# Patient Record
Sex: Female | Born: 2000 | Race: Black or African American | Hispanic: No | Marital: Single | Smoking: Former smoker
Health system: Southern US, Community
[De-identification: ages and names within clinical notes are randomized; demographics above are authoritative.]

## PROBLEM LIST (undated history)

## (undated) DIAGNOSIS — F32A Depression, unspecified: Secondary | ICD-10-CM

## (undated) DIAGNOSIS — F419 Anxiety disorder, unspecified: Secondary | ICD-10-CM

## (undated) DIAGNOSIS — Z789 Other specified health status: Secondary | ICD-10-CM

## (undated) HISTORY — PX: INCISION AND DRAINAGE OF PERITONSILLAR ABCESS: SHX6257

## (undated) HISTORY — DX: Depression, unspecified: F32.A

---
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
## 2021-03-17 ENCOUNTER — Encounter (HOSPITAL_COMMUNITY): Payer: Self-pay | Admitting: Emergency Medicine

## 2021-03-17 ENCOUNTER — Emergency Department (HOSPITAL_COMMUNITY)
Admission: EM | Admit: 2021-03-17 | Discharge: 2021-03-17 | Disposition: A | Discharge status: Home / Self Care | Payer: Medicaid Other | Attending: Emergency Medicine | Admitting: Emergency Medicine

## 2021-03-17 ENCOUNTER — Other Ambulatory Visit: Payer: Self-pay

## 2021-03-17 DIAGNOSIS — F17290 Nicotine dependence, other tobacco product, uncomplicated: Secondary | ICD-10-CM | POA: Diagnosis not present

## 2021-03-17 DIAGNOSIS — R042 Hemoptysis: Secondary | ICD-10-CM | POA: Diagnosis not present

## 2021-03-17 DIAGNOSIS — Z20822 Contact with and (suspected) exposure to covid-19: Secondary | ICD-10-CM | POA: Insufficient documentation

## 2021-03-17 DIAGNOSIS — H1031 Unspecified acute conjunctivitis, right eye: Secondary | ICD-10-CM

## 2021-03-17 DIAGNOSIS — Z79899 Other long term (current) drug therapy: Secondary | ICD-10-CM | POA: Diagnosis not present

## 2021-03-17 DIAGNOSIS — J029 Acute pharyngitis, unspecified: Secondary | ICD-10-CM | POA: Diagnosis not present

## 2021-03-17 DIAGNOSIS — H538 Other visual disturbances: Secondary | ICD-10-CM | POA: Diagnosis present

## 2021-03-17 LAB — SARS CORONAVIRUS 2 (TAT 6-24 HRS): SARS Coronavirus 2: NEGATIVE

## 2021-03-17 MED ORDER — POLYMYXIN B-TRIMETHOPRIM 10000-0.1 UNIT/ML-% OP SOLN: 1.0000 [drp] | Freq: Four times a day (QID) | OPHTHALMIC | 0 refills | Status: AC

## 2021-03-17 NOTE — ED Notes (Signed)
Patient given discharge instructions. Questions were answered. Patient verbalized understanding of discharge instructions and care at home.  Pt discharged with family.

## 2021-03-17 NOTE — ED Triage Notes (Addendum)
C/o R eye swelling and sore throat since Saturday.

## 2021-03-17 NOTE — Discharge Instructions (Signed)
If you develop fever, new or worsening eye pain, trouble breathing or swallowing, or any other new/concerning symptoms then return to the ER for evaluation.

## 2021-03-17 NOTE — ED Provider Notes (Signed)
[facility] EMERGENCY DEPARTMENT Provider Note   CSN: [id number] Arrival date & time: 03/17/21  3532     History No chief complaint on file.   Kristine Franco is a 20 y.o. female.  HPI 20 year old female presents with right eye redness and swelling.  This has been ongoing for 3 days.  Her eye is itchy and some blurry vision.  The light hurts her eyes.  She has had red eyes as well.  She does not wear contacts.  This morning she developed a mild sore throat as well.  She chronically coughs in the morning and when she coughs this morning there was a tinge of blood.  However no other significant or new cough, shortness of breath or fever.  No rhinorrhea. She has had yellow crusting of her eye  Past Medical History:  Diagnosis Date  . Anxiety   . Medical history non-contributory     Patient Active Problem List   Diagnosis Date Noted  . MDD (major depressive disorder), severe (HCC) 03/04/2019  . Suicide attempt (HCC)   . Acetaminophen overdose, intentional self-harm, initial encounter (HCC) 03/01/2019  . Anxiety 03/01/2019  . Suicidal behavior with attempted self-injury (HCC) 03/01/2019  . Cannabis use disorder, mild, abuse   . Severe recurrent major depression without psychotic features (HCC) 06/29/2018  . 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 Suicidal ideation 08/25/2017  . MDD (major depressive disorder), recurrent severe, without psychosis (HCC) 08/24/2017    Past Surgical History:  Procedure Laterality Date  . INCISION AND DRAINAGE OF PERITONSILLAR ABCESS N/A 06/23/2017   Procedure: INCISION AND DRAINAGE OF PERITONSILLAR ABCESS;  Surgeon: Graylin Shiver, MD;  Location: [facility] OR;  Service: ENT;  Laterality: N/A;     OB History    Gravida  1   Para      Term      Preterm      AB  1   Living        SAB  1   IAB      Ectopic      Multiple      Live Births              Family  History  Problem Relation Age of Onset  . Diabetes Maternal Grandmother   . Diabetes Maternal Grandfather     Social History   Tobacco Use  . Smoking status: Current Some Day Smoker    Types: Cigars  . Smokeless tobacco: Never Used  . Tobacco comment: black & milds socially  Vaping Use  . Vaping Use: Never used  Substance Use Topics  . Alcohol use: Yes    Comment: social  . Drug use: Yes    Types: Marijuana    Comment: social    Home Medications Prior to Admission medications   Medication Sig Start Date End Date Taking? Authorizing Provider  trimethoprim-polymyxin b (POLYTRIM) ophthalmic solution Place 1-2 drops into the right eye QID for 5 days. 03/17/21 03/22/21 Yes Pricilla Loveless, MD  ciprofloxacin (CIPRO) 500 MG tablet Take 1 tablet (500 mg total) by mouth 2 (two) times daily. For UTI 03/08/19   Aldean Baker, NP  citalopram (CELEXA) 10 MG tablet Take 1 tablet (10 mg total) by mouth daily. 03/09/19   Aldean Baker, NP  conjugated estrogens (PREMARIN) vaginal cream Place 1 Applicatorful vaginally daily. For pain 03/09/19   Aldean Baker, NP  ferrous sulfate 325 (65 FE) MG EC tablet Take  325 mg by mouth daily.    [provider]    Allergies    Patient has no known allergies.  Review of Systems   Review of Systems  Constitutional: Negative for fever.  HENT: Positive for sore throat.   Eyes: Positive for discharge, redness and itching.  Respiratory: Negative for cough and shortness of breath.   All other systems reviewed and are negative.   Physical Exam Updated Vital Signs BP 111/76 (BP Location: Right Arm)   Pulse 80   Temp 98.8 F (37.1 C) (Oral)   Resp 16   LMP 01/25/2021   SpO2 98%   Physical Exam Vitals and nursing note reviewed.  Constitutional:      General: She is not in acute distress.    Appearance: She is well-developed. She is not ill-appearing or diaphoretic.  HENT:     Head: Normocephalic and atraumatic.     Right Ear: External ear  normal.     Left Ear: External ear normal.     Nose: Nose normal.  Eyes:     General:        Left eye: No discharge.     Extraocular Movements: Extraocular movements intact.     Pupils: Pupils are equal, round, and reactive to light.     Comments: Right periorbital swelling. There is conjunctival injection of the right eye. Some tearing  Cardiovascular:     Rate and Rhythm: Normal rate and regular rhythm.     Heart sounds: Normal heart sounds.  Pulmonary:     Effort: Pulmonary effort is normal.     Breath sounds: Normal breath sounds. No wheezing, rhonchi or rales.  Abdominal:     General: There is no distension.  Skin:    General: Skin is warm and dry.  Neurological:     Mental Status: She is alert.  Psychiatric:        Mood and Affect: Mood is not anxious.     ED Results / Procedures / Treatments   Labs (all labs ordered are listed, but only abnormal results are displayed) Labs Reviewed  SARS CORONAVIRUS 2 (TAT 6-24 HRS)    EKG None  Radiology No results found.  Procedures Procedures   Medications Ordered in ED Medications - No data to display  ED Course  I have reviewed the triage vital signs and the nursing notes.  Pertinent labs & imaging results that were available during my care of the patient were reviewed by me and considered in my medical decision making (see chart for details).    MDM Rules/Calculators/A&P                          Throat is clear at this time.  Her vital signs are normal.  Her lungs are also clear.  While she had a tinge of blood I do not think she needs a hemoptysis work-up necessarily.  She likely has a viral infection causing the sore throat as well as the conjunctivitis but given 3 days of persistent symptoms we will also prescribe trimethoprim polymyxin drops.  She does not wear contacts.  There has been no trauma to her eye.  We will also do a COVID test because of the sore throat.  Will discharge home with return  precautions. Final Clinical Impression(s) / ED Diagnoses Final diagnoses:  Acute conjunctivitis of right eye, unspecified acute conjunctivitis type    Rx / DC Orders ED Discharge Orders  Ordered    trimethoprim-polymyxin b (POLYTRIM) ophthalmic solution  4 times daily        03/17/21 0805           Pricilla Loveless, MD 03/17/21 [phone]

## 2021-10-19 NOTE — L&D Delivery Note (Signed)
OB/GYN Faculty Practice Delivery Note  Kristine Franco is a 21 y.o. G2P0010 s/p VD at [redacted]w[redacted]d. She was admitted for IOL 2/2 NRNST.   ROM: 24h 11m with clear fluid GBS Status:  Positive/-- (08/30 0000) Maximum Maternal Temperature: 97.9  Labor Progress: Initial SVE: 3/70/-2. She then progressed to complete.   Delivery Date/Time: 10/19 @2241  Delivery: Called to room and patient was complete and pushing. Head delivered LOA. No nuchal cord present. Shoulder and body delivered in usual fashion. Infant with spontaneous cry, placed on mother's abdomen, dried and stimulated. Cord clamped x 2 after 1-minute delay, and cut by FOB. Cord blood drawn. Placenta delivered spontaneously with gentle cord traction. Fundus firm with massage and Pitocin. Labia, perineum, vagina, and cervix inspected; right periurethral laceration with brisk bleeding resolved with 3.0 vicryl figure 8 stitch.  Baby Weight: pending  Placenta: 3 vessel, intact. Sent to L&D Complications: None Lacerations: Right periurethral EBL: 82 mL Analgesia: Epidural   Infant:  APGAR (1 MIN): 9   APGAR (5 MINS): 9    Eamon Tantillo Autry-Lott, DO OB Fellow, [place] [facility] [place] 08/06/2022, 11:04 PM

## 2021-12-08 ENCOUNTER — Ambulatory Visit (HOSPITAL_COMMUNITY)
Admission: EM | Admit: 2021-12-08 | Discharge: 2021-12-08 | Disposition: A | Discharge status: Home / Self Care | Payer: Medicaid Other | Attending: Family Medicine | Admitting: Family Medicine

## 2021-12-08 ENCOUNTER — Other Ambulatory Visit: Payer: Self-pay

## 2021-12-08 ENCOUNTER — Encounter (HOSPITAL_COMMUNITY): Payer: Self-pay

## 2021-12-08 DIAGNOSIS — Z3201 Encounter for pregnancy test, result positive: Secondary | ICD-10-CM | POA: Diagnosis not present

## 2021-12-08 DIAGNOSIS — A084 Viral intestinal infection, unspecified: Secondary | ICD-10-CM

## 2021-12-08 DIAGNOSIS — Z3A01 Less than 8 weeks gestation of pregnancy: Secondary | ICD-10-CM

## 2021-12-08 LAB — POC URINE PREG, ED: Preg Test, Ur: POSITIVE — AB

## 2021-12-08 MED ORDER — DOXYLAMINE-PYRIDOXINE 10-10 MG PO TBEC: 2.0000 | DELAYED_RELEASE_TABLET | Freq: Every day | ORAL | 0 refills | Status: DC

## 2021-12-08 NOTE — ED Triage Notes (Signed)
Pt presents with c/o vomiting since Wednesday.   Pt states she has not been able to hold anything down. Denies stomach pain.

## 2021-12-08 NOTE — ED Provider Notes (Signed)
[facility]    CSN: [id number] Arrival date & time: 12/08/21  1747      History   Chief Complaint Chief Complaint  Patient presents with   Emesis    HPI Kristine Franco is a 21 y.o. female presenting with vomiting for about 4 days.  Medical history cannabis use disorder, acetaminophen overdose intentional, depression.  Today endorses 4 days of bilious vomiting and watery diarrhea.  Persistent nausea.  States 3 episodes of vomiting today, this is primarily bilious, but it was filled with some [name] earlier today.  Never any hemoptysis.  Denies abdominal pain.  Has not tried interventions at home.  Does state that she ate out 1 day before symptoms began.  Denies sore throat, cough, congestion. No prior history abdominal procedures.   HPI  Past Medical History:  Diagnosis Date   Anxiety    Medical history non-contributory     Patient Active Problem List   Diagnosis Date Noted   MDD (major depressive disorder), severe ([place]) 03/04/2019   Suicide attempt ([place])    Acetaminophen overdose, intentional self-harm, initial encounter ([place]) 03/01/2019   Anxiety 03/01/2019   Suicidal behavior with attempted self-injury ([place]) 03/01/2019   Cannabis use disorder, mild, abuse    Severe recurrent major depression without psychotic features ([place]) 06/29/2018   MDD (major depressive disorder), recurrent, severe, with psychosis ([place]) 08/25/2017   Suicidal ideation 08/25/2017   MDD (major depressive disorder), recurrent severe, without psychosis ([place]) 08/24/2017    Past Surgical History:  Procedure Laterality Date   INCISION AND DRAINAGE OF PERITONSILLAR ABCESS N/A 06/23/2017   Procedure: INCISION AND DRAINAGE OF PERITONSILLAR ABCESS;  Surgeon: Helayne Seminole, MD;  Location: [facility];  Service: ENT;  Laterality: N/A;    OB History     Gravida  1   Para      Term      Preterm      AB  1   Living         SAB  1   IAB      Ectopic      Multiple      Live  Births               Home Medications    Prior to Admission medications   Medication Sig Start Date End Date Taking? Authorizing Provider  Doxylamine-Pyridoxine (DICLEGIS) 10-10 MG TBEC Take 2 tablets by mouth daily. Start with 2 delayed-release tablets by mouth at bedtime. If symptoms not adequately controlled, increase dose to 4 tablets each day (1 tab in AM, 1 tab mid-afternoon, and 2 tabs at bedtime) 12/08/21  Yes Hazel Sams, PA-C  citalopram (CELEXA) 10 MG tablet Take 1 tablet (10 mg total) by mouth daily. 03/09/19   Connye Burkitt, NP    Family History Family History  Problem Relation Age of Onset   Diabetes Maternal Grandmother    Diabetes Maternal Grandfather     Social History Social History   Tobacco Use   Smoking status: Some Days    Types: Cigars   Smokeless tobacco: Never   Tobacco comments:    black & milds socially  Vaping Use   Vaping Use: Never used  Substance Use Topics   Alcohol use: Yes    Comment: social   Drug use: Yes    Types: Marijuana    Comment: social     Allergies   Patient has no known allergies.   Review of Systems Review of Systems  Constitutional:  Negative for appetite change, chills, diaphoresis, fever and unexpected weight change.  HENT:  Negative for congestion, ear pain, sinus pressure, sinus pain, sneezing, sore throat and trouble swallowing.   Respiratory:  Negative for cough, chest tightness and shortness of breath.   Cardiovascular:  Negative for chest pain.  Gastrointestinal:  Positive for diarrhea, nausea and vomiting. Negative for abdominal distention, abdominal pain, anal bleeding, blood in stool, constipation and rectal pain.  Genitourinary:  Negative for dysuria, flank pain, frequency and urgency.  Musculoskeletal:  Negative for back pain and myalgias.  Neurological:  Negative for dizziness, light-headedness and headaches.  All other systems reviewed and are negative.   Physical Exam Triage Vital Signs ED  Triage Vitals  Enc Vitals Group     BP 12/08/21 1904 108/77     Pulse Rate 12/08/21 1904 65     Resp 12/08/21 1904 17     Temp 12/08/21 1904 98.1 F (36.7 C)     Temp Source 12/08/21 1904 Oral     SpO2 12/08/21 1904 96 %     Weight --      Height --      Head Circumference --      Peak Flow --      Pain Score 12/08/21 1903 0     Pain Loc --      Pain Edu? --      Excl. in [place]? --    No data found.  Updated Vital Signs BP 108/77 (BP Location: Right Arm)    Pulse 65    Temp 98.1 F (36.7 C) (Oral)    Resp 17    LMP  (LMP Unknown)    SpO2 96%   Visual Acuity Right Eye Distance:   Left Eye Distance:   Bilateral Distance:    Right Eye Near:   Left Eye Near:    Bilateral Near:     Physical Exam Vitals reviewed.  Constitutional:      General: She is not in acute distress.    Appearance: Normal appearance. She is not ill-appearing.  HENT:     Head: Normocephalic and atraumatic.     Mouth/Throat:     Mouth: Mucous membranes are moist.     Comments: Moist mucous membranes Eyes:     Extraocular Movements: Extraocular movements intact.     Pupils: Pupils are equal, round, and reactive to light.  Cardiovascular:     Rate and Rhythm: Normal rate and regular rhythm.     Heart sounds: Normal heart sounds.  Pulmonary:     Effort: Pulmonary effort is normal.     Breath sounds: Normal breath sounds. No wheezing, rhonchi or rales.  Abdominal:     General: Bowel sounds are increased. There is no distension.     Palpations: Abdomen is soft. There is no mass.     Tenderness: There is generalized abdominal tenderness. There is no right CVA tenderness, left CVA tenderness, guarding or rebound.     Comments: Generalized TTP without focal component. No mass, hernia. No guarding, rebound. Comfortable throughout exam  Skin:    General: Skin is warm.     Capillary Refill: Capillary refill takes less than 2 seconds.     Comments: Good skin turgor  Neurological:     General: No focal  deficit present.     Mental Status: She is alert and oriented to person, place, and time.  Psychiatric:        Mood and Affect: Mood normal.  Behavior: Behavior normal.     UC Treatments / Results  Labs (all labs ordered are listed, but only abnormal results are displayed) Labs Reviewed  POC URINE PREG, ED - Abnormal; Notable for the following components:      Result Value   Preg Test, Ur POSITIVE (*)    All other components within normal limits    EKG   Radiology No results found.  Procedures Procedures (including critical care time)  Medications Ordered in UC Medications - No data to display  Initial Impression / Assessment and Plan / UC Course  I have reviewed the triage vital signs and the nursing notes.  Pertinent labs & imaging results that were available during my care of the patient were reviewed by me and considered in my medical decision making (see chart for details).     This patient is a very pleasant 21 y.o. year old female presenting with viral gastroenteritis, and unplanned pregnancy approx [redacted] weeks gestation. Afebrile, nontachy. Appears fairly well hydrated. Cap refill <2 seconds.   LMP 1 month ago. U-preg positive.  Initially planned to treat with Zofran ODT; following positive pregnancy test, elected to manage with diclegis instead. Good hyration, BRAT diet.   F/u with [facility] if symptoms worsen/persist. Provided with women's health resources  ED return precautions discussed. Patient verbalizes understanding and agreement.  Discussed treatment plan with attending physician Dr. Windy Carina who is in agreement.     Final Clinical Impressions(s) / UC Diagnoses   Final diagnoses:  Less than [redacted] weeks gestation of pregnancy  Viral gastroenteritis     Discharge Instructions      -Start the Diclegis for your nausea and vomiting. -Make sure to drink plenty of fluids including water, Gatorade, etc. -If your symptoms get worse, or are not  adequately controlled on the medication, follow-up with the women's center.  Information below.  This includes you are not able to keep fluids down despite treatment, you develop new or worsening abdominal pain, new vaginal bleeding, etc.     ED Prescriptions     Medication Sig Dispense Auth. Provider   Doxylamine-Pyridoxine (DICLEGIS) 10-10 MG TBEC Take 2 tablets by mouth daily. Start with 2 delayed-release tablets by mouth at bedtime. If symptoms not adequately controlled, increase dose to 4 tablets each day (1 tab in AM, 1 tab mid-afternoon, and 2 tabs at bedtime) 60 tablet Hazel Sams, PA-C      PDMP not reviewed this encounter.   Hazel Sams, PA-C 12/08/21 1931

## 2021-12-08 NOTE — ED Notes (Signed)
Pt called from lobby, no answer. 

## 2021-12-08 NOTE — Discharge Instructions (Addendum)
-  Start the Diclegis for your nausea and vomiting. -Make sure to drink plenty of fluids including water, Gatorade, etc. -If your symptoms get worse, or are not adequately controlled on the medication, follow-up with the women's center.  Information below.  This includes you are not able to keep fluids down despite treatment, you develop new or worsening abdominal pain, new vaginal bleeding, etc.

## 2022-01-19 ENCOUNTER — Ambulatory Visit (INDEPENDENT_AMBULATORY_CARE_PROVIDER_SITE_OTHER): Payer: Medicaid Other

## 2022-01-19 VITALS — BP 111/70 | HR 80 | Ht 62.5 in | Wt 131.3 lb

## 2022-01-19 DIAGNOSIS — Z3481 Encounter for supervision of other normal pregnancy, first trimester: Secondary | ICD-10-CM

## 2022-01-19 DIAGNOSIS — O3680X0 Pregnancy with inconclusive fetal viability, not applicable or unspecified: Secondary | ICD-10-CM | POA: Diagnosis not present

## 2022-01-19 DIAGNOSIS — Z3402 Encounter for supervision of normal first pregnancy, second trimester: Secondary | ICD-10-CM | POA: Insufficient documentation

## 2022-01-19 DIAGNOSIS — Z3491 Encounter for supervision of normal pregnancy, unspecified, first trimester: Secondary | ICD-10-CM | POA: Insufficient documentation

## 2022-01-19 MED ORDER — BLOOD PRESSURE KIT DEVI: 1.0000 | 0 refills | Status: DC

## 2022-01-19 MED ORDER — GOJJI WEIGHT SCALE MISC: 1.0000 | 0 refills | Status: DC

## 2022-01-19 NOTE — Progress Notes (Cosign Needed)
New OB Intake ? ?I connected with  Rajean Chandra on 01/19/22 at  3:10 PM EDT by in person  and verified that I am speaking with the correct person using two identifiers. Nurse is located at [facility] and pt is located at [facility]. ? ?I discussed the limitations, risks, security and privacy concerns of performing an evaluation and management service by telephone and the availability of in person appointments. I also discussed with the patient that there may be a patient responsible charge related to this service. The patient expressed understanding and agreed to proceed. ? ?I explained I am completing New OB Intake today. We discussed her EDD of 08/03/22 that is based on LMP of 10/27/21. Pt is G2/P0010. I reviewed her allergies, medications, Medical/Surgical/OB history, and appropriate screenings. I informed her of [facility] services. Based on history, this is a/an  pregnancy uncomplicated .  ? ?Patient Active Problem List  ? Diagnosis Date Noted  ? MDD (major depressive disorder), severe (HCC) 03/04/2019  ? Suicide attempt [facility])   ? Acetaminophen overdose, intentional self-harm, initial encounter (HCC) 03/01/2019  ? Anxiety 03/01/2019  ? Suicidal behavior with attempted self-injury (HCC) 03/01/2019  ? Cannabis use disorder, mild, abuse   ? Severe recurrent major depression without psychotic features (HCC) 06/29/2018  ? MDD (major depressive disorder), recurrent, severe, with psychosis (HCC) 08/25/2017  ? Suicidal ideation 08/25/2017  ? MDD (major depressive disorder), recurrent severe, without psychosis (HCC) 08/24/2017  ? ? ?Concerns addressed today ? ?Delivery Plans:  ?Plans to deliver at [facility]  ? ?MyChart/Babyscripts ?MyChart access verified. I explained pt will have some visits in office and some virtually. Babyscripts instructions given and order placed. Patient verifies receipt of registration text/e-mail. Account successfully created and app downloaded. ? ?Blood Pressure Cuff  ?Blood pressure cuff ordered for  patient to pick-up from [employer]. Explained after first prenatal appt pt will check weekly and document in Babyscripts. ? ?Weight scale: Patient does / does not  have weight scale. Weight scale ordered for patient to pick up from [employer].  ? ?Anatomy US ?Explained first scheduled [place] will be around 19 weeks. Dating and viability scan performed today ? ?Labs ?Discussed Avel[facility] Laine genetic screening with patient. Would like both Panorama and Horizon drawn at new OB visit.Also if interested in genetic testing, tell patient she will need AFP 15-21 weeks to complete genetic testing .Routine prenatal labs needed. ? ?Covid Vaccine ?Patient has not covid vaccine.  ?  ?Is patient interested in [place]? Yes  "Interested in [facility] - Schedule next visit with CNM" on sticky note ? ?Informed patient of [facility] website  and placed link in her AVS.  ? ?Social Determinants of Health ?Food Insecurity: Patient denies food insecurity. ?WIC Referral: Patient is interested in referral to [facility].  ?Transportation: Patient denies transportation needs. ?Childcare: Discussed no children allowed at ultrasound appointments. Offered childcare services; patient declines childcare services at this time. ? ?Send link to Pregnancy Navigators ? ? ?Placed OB Box on problem list and updated ? ?First visit review ?I reviewed new OB appt with pt. I explained she will have a pelvic exam, ob bloodwork with genetic screening, and PAP smear. Explained pt will be seen by Coral Ceo at first visit; encounter routed to appropriate provider. Explained that patient will be seen by pregnancy navigator following visit with provider. [facility] information placed in AVS.  ? ?Hamilton Capri, RN ?01/19/2022  3:10 PM  ?

## 2022-01-20 NOTE — Progress Notes (Signed)
Agree with nurses's documentation of this patient's clinic encounter.  Lynelle Weiler L, MD  

## 2022-01-28 ENCOUNTER — Encounter: Payer: Self-pay | Admitting: Obstetrics

## 2022-01-28 ENCOUNTER — Ambulatory Visit (INDEPENDENT_AMBULATORY_CARE_PROVIDER_SITE_OTHER): Payer: Medicaid Other | Admitting: Obstetrics

## 2022-01-28 ENCOUNTER — Other Ambulatory Visit (HOSPITAL_COMMUNITY)
Admission: RE | Admit: 2022-01-28 | Discharge: 2022-01-28 | Disposition: A | Discharge status: Home / Self Care | Payer: Medicaid Other | Source: Ambulatory Visit | Attending: Obstetrics | Admitting: Obstetrics

## 2022-01-28 VITALS — BP 112/65 | HR 76 | Wt 130.0 lb

## 2022-01-28 DIAGNOSIS — Z3A13 13 weeks gestation of pregnancy: Secondary | ICD-10-CM | POA: Diagnosis not present

## 2022-01-28 DIAGNOSIS — Z3480 Encounter for supervision of other normal pregnancy, unspecified trimester: Secondary | ICD-10-CM

## 2022-01-28 NOTE — Progress Notes (Signed)
Subjective:  ? ? Kristine Franco is being seen today for her first obstetrical visit.  This is not a planned pregnancy. She is at [redacted]w[redacted]d gestation. Her obstetrical history is significant for  none . Relationship with FOB: significant other, not living together. Patient does not intend to breast feed. Pregnancy history fully reviewed. ? ?The information documented in the HPI was reviewed and verified. ? ?Menstrual History: ?OB History   ? ? Gravida  ?2  ? Para  ?   ? Term  ?   ? Preterm  ?   ? AB  ?1  ? Living  ?   ?  ? ? SAB  ?1  ? IAB  ?   ? Ectopic  ?   ? Multiple  ?   ? Live Births  ?   ?   ?  ?  ?  ? ?Patient's last menstrual period was 10/27/2021. ?  ? ?Past Medical History:  ?Diagnosis Date  ? Anxiety   ? Depression   ? Medical history non-contributory   ?  ?Past Surgical History:  ?Procedure Laterality Date  ? INCISION AND DRAINAGE OF PERITONSILLAR ABCESS N/A 06/23/2017  ? Procedure: INCISION AND DRAINAGE OF PERITONSILLAR ABCESS;  Surgeon: Graylin Shiver, MD;  [facility];  Service: ENT;  Laterality: N/A;  ?  ?(Not in a hospital admission) ? ?No Known Allergies  ?Social History  ? ?Tobacco Use  ? Smoking status: Former  ?  Types: Cigars  ?  Quit date: 12/16/2021  ?  Years since quitting: 0.1  ? Smokeless tobacco: Never  ? Tobacco comments:  ?  black & milds socially  ?Substance Use Topics  ? Alcohol use: Not Currently  ?  Comment: social, prior to pregnancy  ?  ?Family History  ?Problem Relation Age of Onset  ? Hypertension Mother   ? Diabetes Maternal Grandmother   ? Diabetes Maternal Grandfather   ?  ? ?Review of Systems ?Constitutional: negative for weight loss ?Gastrointestinal: negative for vomiting ?Genitourinary:negative for genital lesions and vaginal discharge and dysuria ?Musculoskeletal:negative for back pain ?Behavioral/Psych: negative for abusive relationship, depression, illegal drug usage and tobacco use  ?  ?Objective:  ? ? BP 112/65   Pulse 76   Wt 130 lb (59 kg)   LMP 10/27/2021    BMI 23.40 kg/m?  ?General Appearance:    Alert, cooperative, no distress, appears stated age  ?Head:    Normocephalic, without obvious abnormality, atraumatic  ?Eyes:    PERRL, conjunctiva/corneas clear, EOM's intact, fundi  ?  benign, both eyes  ?Ears:    Normal TM's and external ear canals, both ears  ?Nose:   Nares normal, septum midline, mucosa normal, no drainage    or sinus tenderness  ?Throat:   Lips, mucosa, and tongue normal; teeth and gums normal  ?Neck:   Supple, symmetrical, trachea midline, no adenopathy;  ?  thyroid:  no enlargement/tenderness/nodules; no carotid ?  bruit or JVD  ?Back:     Symmetric, no curvature, ROM normal, no CVA tenderness  ?Lungs:     Clear to auscultation bilaterally, respirations unlabored  ?Chest Wall:    No tenderness or deformity  ? Heart:    Regular rate and rhythm, S1 and S2 normal, no murmur, rub   or gallop  ?Breast Exam:    No tenderness, masses, or nipple abnormality  ?Abdomen:     Soft, non-tender, bowel sounds active all four quadrants,  ?  no masses, no organomegaly  ?Genitalia:  Normal female without lesion, discharge or tenderness  ?Extremities:   Extremities normal, atraumatic, no cyanosis or edema  ?Pulses:   2+ and symmetric all extremities  ?Skin:   Skin color, texture, turgor normal, no rashes or lesions  ?Lymph nodes:   Cervical, supraclavicular, and axillary nodes normal  ?Neurologic:   CNII-XII intact, normal strength, sensation and reflexes  ?  throughout  ? ?  ? Lab Review ?Urine pregnancy test ?Labs reviewed yes ?Radiologic studies reviewed yes ? ?Assessment:  ? ? Pregnancy at [redacted]w[redacted]d weeks  ?  ?Plan:  ? ?  ? ?Prenatal vitamins.  ?Counseling provided regarding continued use of seat belts, cessation of alcohol consumption, smoking or use of illicit drugs; infection precautions i.e., influenza/TDAP immunizations, toxoplasmosis,CMV, parvovirus, listeria and varicella; workplace safety, exercise during pregnancy; routine dental care, safe medications,  sexual activity, hot tubs, saunas, pools, travel, caffeine use, fish and methlymercury, potential toxins, hair treatments, varicose veins ?Weight gain recommendations per IOM guidelines reviewed: underweight/BMI< 18.5--> gain 28 - 40 lbs; normal weight/BMI 18.5 - 24.9--> gain 25 - 35 lbs; overweight/BMI 25 - 29.9--> gain 15 - 25 lbs; obese/BMI >30->gain  11 - 20 lbs ?Problem list reviewed and updated. ?FIRST/CF mutation testing/NIPT/QUAD SCREEN/fragile X/Ashkenazi Jewish population testing/Spinal muscular atrophy discussed: requested. ?Role of ultrasound in pregnancy discussed; fetal survey: requested. ?Amniocentesis discussed: not indicated. ? ?Orders Placed This Encounter  ?Procedures  ? Culture, OB Urine  ? [place] MFM OB COMP + 14 WK  ?  Standing Status:   Future  ?  Standing Expiration Date:   01/29/2023  ?  Order Specific Question:   Reason for Exam (SYMPTOM  OR DIAGNOSIS REQUIRED)  ?  Answer:   Anatomy  ?  Order Specific Question:   Preferred Location  ?  Answer:   [facility] Ultrasound  ? CBC/D/Plt+RPR+Rh+ABO+RubIgG...  ? Genetic Screening  ? HgB A1c  ? ? ?Follow up in 2 weeks. ? ? ?Brock Bad, MD ?01/28/2022 1:44 PM  ?

## 2022-01-29 LAB — CBC/D/PLT+RPR+RH+ABO+RUBIGG...
Antibody Screen: NEGATIVE
Basophils Absolute: 0 10*3/uL (ref 0.0–0.2)
Basos: 1 %
EOS (ABSOLUTE): 0.4 10*3/uL (ref 0.0–0.4)
Eos: 6 %
HCV Ab: NONREACTIVE
HIV Screen 4th Generation wRfx: NONREACTIVE
Hematocrit: 39.1 % (ref 34.0–46.6)
Hemoglobin: 13.1 g/dL (ref 11.1–15.9)
Hepatitis B Surface Ag: NEGATIVE
Immature Grans (Abs): 0 10*3/uL (ref 0.0–0.1)
Immature Granulocytes: 0 %
Lymphocytes Absolute: 1.6 10*3/uL (ref 0.7–3.1)
Lymphs: 20 %
MCH: 32.8 pg (ref 26.6–33.0)
MCHC: 33.5 g/dL (ref 31.5–35.7)
MCV: 98 fL — ABNORMAL HIGH (ref 79–97)
Monocytes Absolute: 0.8 10*3/uL (ref 0.1–0.9)
Monocytes: 11 %
Neutrophils Absolute: 4.9 10*3/uL (ref 1.4–7.0)
Neutrophils: 62 %
Platelets: 216 10*3/uL (ref 150–450)
RBC: 4 x10E6/uL (ref 3.77–5.28)
RDW: 12.1 % (ref 11.7–15.4)
RPR Ser Ql: NONREACTIVE
Rh Factor: POSITIVE
Rubella Antibodies, IGG: 2.71 index (ref >0.99)
WBC: 7.8 10*3/uL (ref 3.4–10.8)

## 2022-01-29 LAB — CERVICOVAGINAL ANCILLARY ONLY
Bacterial Vaginitis (gardnerella): NEGATIVE
Candida Glabrata: NEGATIVE
Candida Vaginitis: POSITIVE — AB
Chlamydia: NEGATIVE
Comment: NEGATIVE
Comment: NEGATIVE
Comment: NEGATIVE
Comment: NEGATIVE
Comment: NEGATIVE
Comment: NORMAL
Neisseria Gonorrhea: NEGATIVE
Trichomonas: NEGATIVE

## 2022-01-29 LAB — HEMOGLOBIN A1C
Est. average glucose Bld gHb Est-mCnc: 103 mg/dL
Hgb A1c MFr Bld: 5.2 % (ref 4.8–5.6)

## 2022-01-29 LAB — HCV INTERPRETATION

## 2022-01-30 ENCOUNTER — Other Ambulatory Visit: Payer: Self-pay | Admitting: Obstetrics

## 2022-01-30 DIAGNOSIS — B379 Candidiasis, unspecified: Secondary | ICD-10-CM

## 2022-01-30 MED ORDER — TERCONAZOLE 0.8 % VA CREA: 1.0000 | TOPICAL_CREAM | Freq: Every day | VAGINAL | 0 refills | Status: DC

## 2022-02-02 ENCOUNTER — Encounter: Payer: Self-pay | Admitting: Obstetrics

## 2022-02-02 ENCOUNTER — Other Ambulatory Visit: Payer: Self-pay | Admitting: Obstetrics

## 2022-02-02 DIAGNOSIS — N3000 Acute cystitis without hematuria: Secondary | ICD-10-CM

## 2022-02-02 LAB — CYTOLOGY - PAP: Diagnosis: NEGATIVE

## 2022-02-02 LAB — URINE CULTURE, OB REFLEX

## 2022-02-02 LAB — CULTURE, OB URINE

## 2022-02-02 MED ORDER — AMOXICILLIN-POT CLAVULANATE 875-125 MG PO TABS: 1.0000 | ORAL_TABLET | Freq: Two times a day (BID) | ORAL | 0 refills | Status: DC

## 2022-02-03 ENCOUNTER — Telehealth: Payer: Self-pay | Admitting: Emergency Medicine

## 2022-02-03 ENCOUNTER — Other Ambulatory Visit: Payer: Self-pay | Admitting: Obstetrics

## 2022-02-03 NOTE — Telephone Encounter (Signed)
Attempt TC to pt re results and Rx. LVM ?

## 2022-02-06 ENCOUNTER — Encounter: Payer: Self-pay | Admitting: Emergency Medicine

## 2022-02-13 ENCOUNTER — Other Ambulatory Visit: Payer: Self-pay | Admitting: Obstetrics

## 2022-02-13 DIAGNOSIS — J301 Allergic rhinitis due to pollen: Secondary | ICD-10-CM

## 2022-02-13 DIAGNOSIS — Z3480 Encounter for supervision of other normal pregnancy, unspecified trimester: Secondary | ICD-10-CM

## 2022-02-13 MED ORDER — LORATADINE 10 MG PO TABS: 10.0000 mg | ORAL_TABLET | Freq: Every day | ORAL | 11 refills | Status: DC

## 2022-02-13 MED ORDER — VITAFOL ULTRA 29-0.6-0.4-200 MG PO CAPS: 1.0000 | ORAL_CAPSULE | Freq: Every day | ORAL | 4 refills | Status: DC

## 2022-02-18 ENCOUNTER — Encounter: Payer: Self-pay | Admitting: Obstetrics

## 2022-02-18 DIAGNOSIS — O285 Abnormal chromosomal and genetic finding on antenatal screening of mother: Secondary | ICD-10-CM

## 2022-02-18 DIAGNOSIS — Z3480 Encounter for supervision of other normal pregnancy, unspecified trimester: Secondary | ICD-10-CM

## 2022-02-18 NOTE — Progress Notes (Signed)
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 ?U/S order changed to Detail due to Horizon results. ? ?Pt made aware of results and referral.  ? ?

## 2022-02-25 ENCOUNTER — Ambulatory Visit (INDEPENDENT_AMBULATORY_CARE_PROVIDER_SITE_OTHER): Payer: Medicaid Other | Admitting: Obstetrics and Gynecology

## 2022-02-25 VITALS — BP 100/62 | HR 67 | Wt 133.6 lb

## 2022-02-25 DIAGNOSIS — Z3402 Encounter for supervision of normal first pregnancy, second trimester: Secondary | ICD-10-CM

## 2022-02-25 DIAGNOSIS — Z3A17 17 weeks gestation of pregnancy: Secondary | ICD-10-CM

## 2022-02-25 NOTE — Progress Notes (Signed)
? ?  PRENATAL VISIT NOTE ? ?Subjective:  ?Kristine Franco is a 21 y.o. G2P0010 at [redacted]w[redacted]d being seen today for ongoing prenatal care.  She is currently monitored for the following issues for this low-risk pregnancy and has MDD (major depressive disorder), recurrent severe, without psychosis (HCC); MDD (major depressive disorder), recurrent, severe, with psychosis (HCC); Suicidal ideation; Severe recurrent major depression without psychotic features (HCC); Cannabis use disorder, mild, abuse; Acetaminophen overdose, intentional self-harm, initial encounter (HCC); Anxiety; Suicidal behavior with attempted self-injury (HCC); Suicide attempt [facility]); MDD (major depressive disorder), severe (HCC); and Supervision of normal first pregnancy in second trimester on their problem list. ? ?Patient doing well with no acute concerns today. She reports  intermittent mild cramping .  Contractions: Irritability. Vag. Bleeding: None.  Movement: Present. Denies leaking of fluid.  ? ?The following portions of the patient's history were reviewed and updated as appropriate: allergies, current medications, past family history, past medical history, past social history, past surgical history and problem list. Problem list updated. ? ?Objective:  ? ?Vitals:  ? 02/25/22 1004  ?BP: 100/62  ?Pulse: 67  ?Weight: 60.6 kg  ? ? ?Fetal Status: Fetal Heart Rate (bpm): 145 Fundal Height: 18 cm Movement: Present    ? ?General:  Alert, oriented and cooperative. Patient is in no acute distress.  ?Skin: Skin is warm and dry. No rash noted.   ?Cardiovascular: Normal heart rate noted  ?Respiratory: Normal respiratory effort, no problems with respiration noted  ?Abdomen: Soft, gravid, appropriate for gestational age.  Pain/Pressure: Absent     ?Pelvic: Cervical exam deferred        ?Extremities: Normal range of motion.  Edema: Trace  ?Mental Status:  Normal mood and affect. Normal behavior. Normal judgment and thought content.  ? ?Assessment and Plan:   ?Pregnancy: G2P0010 at [redacted]w[redacted]d ? ?1. [redacted] weeks gestation of pregnancy ? ? ?2. Supervision of normal first pregnancy in second trimester ?Continue routine care, emphasized increased hydration ?- AFP, Serum, Open Spina Bifida ? ?Preterm labor symptoms and general obstetric precautions including but not limited to vaginal bleeding, contractions, leaking of fluid and fetal movement were reviewed in detail with the patient. ? ?Please refer to After Visit Summary for other counseling recommendations.  ? ?Return in about 4 weeks (around 03/25/2022) for ROB, in person. ? ? ?Mariel Aloe, MD ?Faculty Attending ?[facility] for [facility] ?  ?

## 2022-02-25 NOTE — Progress Notes (Signed)
Patient presents for ROB. Patient has no concerns today. 

## 2022-03-03 LAB — AFP, SERUM, OPEN SPINA BIFIDA
AFP MoM: 0.78
AFP Value: 36.4 ng/mL
Gest. Age on Collection Date: 17 weeks
Maternal Age At EDD: 21.7 yr
OSBR Risk 1 IN: 10000
Test Results:: NEGATIVE
Weight: 133 [lb_av]

## 2022-03-09 ENCOUNTER — Ambulatory Visit: Payer: Medicaid Other | Attending: Obstetrics

## 2022-03-09 ENCOUNTER — Ambulatory Visit: Payer: Medicaid Other | Admitting: *Deleted

## 2022-03-09 ENCOUNTER — Other Ambulatory Visit: Payer: Self-pay | Admitting: *Deleted

## 2022-03-09 ENCOUNTER — Encounter: Payer: Self-pay | Admitting: *Deleted

## 2022-03-09 ENCOUNTER — Ambulatory Visit: Payer: Medicaid Other

## 2022-03-09 VITALS — BP 102/59 | HR 75

## 2022-03-09 DIAGNOSIS — Z3A19 19 weeks gestation of pregnancy: Secondary | ICD-10-CM | POA: Insufficient documentation

## 2022-03-09 DIAGNOSIS — Z363 Encounter for antenatal screening for malformations: Secondary | ICD-10-CM | POA: Diagnosis not present

## 2022-03-09 DIAGNOSIS — O99342 Other mental disorders complicating pregnancy, second trimester: Secondary | ICD-10-CM | POA: Diagnosis not present

## 2022-03-09 DIAGNOSIS — Z3689 Encounter for other specified antenatal screening: Secondary | ICD-10-CM | POA: Insufficient documentation

## 2022-03-09 DIAGNOSIS — Z148 Genetic carrier of other disease: Secondary | ICD-10-CM | POA: Diagnosis not present

## 2022-03-09 DIAGNOSIS — Z3480 Encounter for supervision of other normal pregnancy, unspecified trimester: Secondary | ICD-10-CM | POA: Diagnosis not present

## 2022-03-09 DIAGNOSIS — O285 Abnormal chromosomal and genetic finding on antenatal screening of mother: Secondary | ICD-10-CM | POA: Diagnosis not present

## 2022-03-09 DIAGNOSIS — O358XX0 Maternal care for other (suspected) fetal abnormality and damage, not applicable or unspecified: Secondary | ICD-10-CM | POA: Insufficient documentation

## 2022-03-10 ENCOUNTER — Telehealth: Payer: Self-pay

## 2022-03-10 NOTE — Telephone Encounter (Signed)
Left message for patient to call the office to reschedule for Thursday 5/25 with [facility].

## 2022-03-11 ENCOUNTER — Encounter: Payer: Self-pay | Admitting: Genetics

## 2022-03-11 ENCOUNTER — Ambulatory Visit: Payer: Medicaid Other | Attending: Obstetrics

## 2022-03-20 ENCOUNTER — Inpatient Hospital Stay (HOSPITAL_COMMUNITY)
Admission: AD | Admit: 2022-03-20 | Discharge: 2022-03-20 | Disposition: A | Discharge status: Home / Self Care | Payer: Medicaid Other | Attending: Obstetrics & Gynecology | Admitting: Obstetrics & Gynecology

## 2022-03-20 ENCOUNTER — Encounter (HOSPITAL_COMMUNITY): Payer: Self-pay | Admitting: Obstetrics & Gynecology

## 2022-03-20 DIAGNOSIS — R1110 Vomiting, unspecified: Secondary | ICD-10-CM | POA: Diagnosis present

## 2022-03-20 DIAGNOSIS — O99322 Drug use complicating pregnancy, second trimester: Secondary | ICD-10-CM | POA: Diagnosis not present

## 2022-03-20 DIAGNOSIS — Z3402 Encounter for supervision of normal first pregnancy, second trimester: Secondary | ICD-10-CM

## 2022-03-20 DIAGNOSIS — F12188 Cannabis abuse with other cannabis-induced disorder: Secondary | ICD-10-CM | POA: Diagnosis not present

## 2022-03-20 DIAGNOSIS — Z3A20 20 weeks gestation of pregnancy: Secondary | ICD-10-CM | POA: Insufficient documentation

## 2022-03-20 LAB — URINALYSIS, ROUTINE W REFLEX MICROSCOPIC
Bilirubin Urine: NEGATIVE
Glucose, UA: NEGATIVE mg/dL
Hgb urine dipstick: NEGATIVE
Ketones, ur: 5 mg/dL — AB
Leukocytes,Ua: NEGATIVE
Nitrite: NEGATIVE
Protein, ur: NEGATIVE mg/dL
Specific Gravity, Urine: 1.009 (ref 1.005–1.030)
pH: 9 — ABNORMAL HIGH (ref 5.0–8.0)

## 2022-03-20 LAB — BASIC METABOLIC PANEL
Anion gap: 8 (ref 5–15)
BUN: 5 mg/dL — ABNORMAL LOW (ref 6–20)
CO2: 21 mmol/L — ABNORMAL LOW (ref 22–32)
Calcium: 8.9 mg/dL (ref 8.9–10.3)
Chloride: 107 mmol/L (ref 98–111)
Creatinine, Ser: 0.43 mg/dL — ABNORMAL LOW (ref 0.44–1.00)
GFR, Estimated: >60 mL/min (ref >60)
Glucose, Bld: 69 mg/dL — ABNORMAL LOW (ref 70–99)
Potassium: 3.6 mmol/L (ref 3.5–5.1)
Sodium: 136 mmol/L (ref 135–145)

## 2022-03-20 LAB — CBC
HCT: 32.2 % — ABNORMAL LOW (ref 36.0–46.0)
Hemoglobin: 11.1 g/dL — ABNORMAL LOW (ref 12.0–15.0)
MCH: 33.5 pg (ref 26.0–34.0)
MCHC: 34.5 g/dL (ref 30.0–36.0)
MCV: 97.3 fL (ref 80.0–100.0)
Platelets: 213 10*3/uL (ref 150–400)
RBC: 3.31 MIL/uL — ABNORMAL LOW (ref 3.87–5.11)
RDW: 13.2 % (ref 11.5–15.5)
WBC: 8.2 10*3/uL (ref 4.0–10.5)
nRBC: 0 % (ref 0.0–0.2)

## 2022-03-20 MED ORDER — DIPHENHYDRAMINE HCL 50 MG/ML IJ SOLN
25.0000 mg | Freq: Once | INTRAMUSCULAR | Status: AC
  Administered 2022-03-20: 25 mg via INTRAVENOUS
  Filled 2022-03-20: qty 1

## 2022-03-20 MED ORDER — HALOPERIDOL LACTATE 5 MG/ML IJ SOLN
2.0000 mg | Freq: Once | INTRAMUSCULAR | Status: AC
  Administered 2022-03-20: 2 mg via INTRAVENOUS
  Filled 2022-03-20: qty 0.4

## 2022-03-20 MED ORDER — PROMETHAZINE HCL 12.5 MG PO TABS: 12.5000 mg | ORAL_TABLET | Freq: Four times a day (QID) | ORAL | 0 refills | Status: DC | PRN

## 2022-03-20 MED ORDER — LACTATED RINGERS IV BOLUS
1000.0000 mL | Freq: Once | INTRAVENOUS | Status: AC
  Administered 2022-03-20: 1000 mL via INTRAVENOUS

## 2022-03-20 NOTE — Discharge Instructions (Signed)
Vomiting in First Trimester Follow these instructions at home: To help relieve your symptoms, listen to your body. Everyone is different and has different preferences. Find what works best for you. Here are some things you can try to help relieve your symptoms: Meals and snacks Eat 5-6 small meals daily instead of 3 large meals. Eating small meals and snacks can help you avoid an empty stomach. Before getting out of bed, eat a couple of crackers to avoid moving around on an empty stomach. Eat a protein-rich snack before bed. Examples include cheese and crackers, or a peanut butter sandwich made with 1 slice of whole-wheat bread and 1 tsp (5 g) of peanut butter. Eat and drink slowly. Try eating starchy foods as these are usually tolerated well. Examples include cereal, toast, bread, potatoes, pasta, rice, and pretzels. Eat at least one serving of protein with your meals and snacks. Protein options include lean meats, poultry, seafood, beans, nuts, nut butters, eggs, cheese, and yogurt. Eat or suck on things that have ginger in them. It may help to relieve nausea. Add  tsp (0.44 g) ground ginger to hot tea, or choose ginger tea.   Fluids It is important to stay hydrated. Try to: Drink small amounts of fluids often. Drink fluids 30 minutes before or after a meal to help lessen the feeling of a full stomach. Drink 100% fruit juice or an electrolyte drink. An electrolyte drink contains sodium, potassium, and chloride. Drink fluids that are cold, clear, and carbonated or sour. These include lemonade, ginger ale, lemon-lime soda, ice water, and sparkling water. Things to avoid Avoid the following: Eating foods that trigger your symptoms. These may include spicy foods, coffee, high-fat foods, very sweet foods, and acidic foods. Drinking more than 1 cup of fluid at a time. Skipping meals. Nausea can be more intense on an empty stomach. If you cannot tolerate food, do not force it. Try sucking on ice  chips or other frozen items and make up for missed calories later. Lying down within 2 hours after eating. Being exposed to environmental triggers. These may include food smells, smoky rooms, closed spaces, rooms with strong smells, warm or humid places, overly loud and noisy rooms, and rooms with motion or flickering lights. Try eating meals in a well-ventilated area that is free of strong smells. Making quick and sudden changes in your movement. Taking iron pills and multivitamins that contain iron. If you take prescription iron pills, do not stop taking them unless your health care provider approves. Preparing food. The smell of food can spoil your appetite or trigger nausea. General instructions Brush your teeth or use a mouth rinse after meals. Take over-the-counter and prescription medicines only as told by your health care provider. Follow instructions from your health care provider about eating or drinking restrictions. Talk with your health care provider about starting a supplement of vitamin B6. Continue to take your prenatal vitamins as told by your health care provider. If you are having trouble taking your prenatal vitamins, talk with your health care provider about other options. Keep all follow-up visits. This is important. Follow-up visits include prenatal visits. Contact a health care provider if: You have pain in your abdomen. You have a severe headache. You have vision problems. You are losing weight. You feel weak or dizzy. You cannot eat or drink without vomiting, especially if this goes on for a full day. Get help right away if: You cannot drink fluids without vomiting. You vomit blood. You have constant   nausea and vomiting. You are very weak. You faint. You have a fever and your symptoms suddenly get worse. Summary Making some changes to your eating habits may help relieve nausea and vomiting. This condition may be managed with lifestyle changes and medicines as  prescribed by your health care provider. If medicines do not help relieve nausea and vomiting, you may need to receive fluids through an IV at the hospital. This information is not intended to replace advice given to you by your health care provider. Make sure you discuss any questions you have with your health care provider. Document Revised: 04/29/2020 Document Reviewed: 04/29/2020 Elsevier Patient Education  2021 Elsevier Inc.  

## 2022-03-20 NOTE — MAU Provider Note (Addendum)
Chief Complaint: Nausea  Event Date/Time  First Provider Initiated Contact with Patient 03/20/22 [phone]     SUBJECTIVE HPI: Adelis Docter is a 21 y.o. G2P0010 at 47w4dwho presents to Maternity Admissions reporting nausea and vomiting that began this morning around 0700. She states she has vomiting 3 times, with some green vomit. Also reports some lower abdominal pain and headache. The patient endorses some marijuana use 4 days ago. Denies fever/chills, CP, SOB.    Past Medical History:  Diagnosis Date   Anxiety    Asthma 2019   Depression    Medical history non-contributory    OB History  Gravida Para Term Preterm AB Living  2       1    SAB IAB Ectopic Multiple Live Births  1            # Outcome Date GA Lbr Len/2nd Weight Sex Delivery Anes PTL Lv  2 Current           1 SAB 08/18/17           Past Surgical History:  Procedure Laterality Date   INCISION AND DRAINAGE OF PERITONSILLAR ABCESS N/A 06/23/2017   Procedure: INCISION AND DRAINAGE OF PERITONSILLAR ABCESS;  Surgeon: MHelayne Seminole MD;  Location: [facility] OR;  Service: ENT;  Laterality: N/A;   Social History   Socioeconomic History   Marital status: Single    Spouse name: Not on file   Number of children: Not on file   Years of education: Not on file   Highest education level: Not on file  Occupational History   Not on file  Tobacco Use   Smoking status: Former    Types: Cigars    Quit date: 12/16/2021    Years since quitting: 0.2   Smokeless tobacco: Never   Tobacco comments:    black & milds socially  Vaping Use   Vaping Use: Former   Quit date: 12/16/2021   Substances: Nicotine, Flavoring  Substance and Sexual Activity   Alcohol use: Not Currently    Comment: social, prior to pregnancy   Drug use: Yes    Types: Marijuana    Comment: Pt states she smokes when shes in pain   Sexual activity: Yes    Partners: Male    Birth control/protection: None  Other Topics Concern   Not on file  Social History  Narrative   Not on file   Social Determinants of Health   Financial Resource Strain: Not on file  Food Insecurity: Not on file  Transportation Needs: Not on file  Physical Activity: Not on file  Stress: Not on file  Social Connections: Not on file  Intimate Partner Violence: Not on file   Family History  Problem Relation Age of Onset   Hypertension Mother    Diabetes Maternal Grandmother    Diabetes Maternal Grandfather    No current facility-administered medications on file prior to encounter.   Current Outpatient Medications on File Prior to Encounter  Medication Sig Dispense Refill   Doxylamine-Pyridoxine (DICLEGIS) 10-10 MG TBEC Take 2 tablets by mouth daily. Start with 2 delayed-release tablets by mouth at bedtime. If symptoms not adequately controlled, increase dose to 4 tablets each day (1 tab in AM, 1 tab mid-afternoon, and 2 tabs at bedtime) 60 tablet 0   loratadine (CLARITIN) 10 MG tablet Take 1 tablet (10 mg total) by mouth daily. 30 tablet 11   Prenat-Fe Poly-Methfol-FA-DHA (VITAFOL ULTRA) 29-0.6-0.4-200 MG CAPS Take 1 capsule by mouth  daily before breakfast. 90 capsule 4   terconazole (TERAZOL 3) 0.8 % vaginal cream Place 1 applicator vaginally at bedtime. 20 g 0   Blood Pressure Monitoring (BLOOD PRESSURE KIT) DEVI 1 kit by Does not apply route once a week. 1 each 0   Misc. Devices (GOJJI WEIGHT SCALE) MISC 1 Device by Does not apply route every 30 (thirty) days. 1 each 0   No Known Allergies  I have reviewed patient's Past Medical Hx, Surgical Hx, Family Hx, Social Hx, medications and allergies.   Review of Systems  Constitutional:  Positive for appetite change and fatigue. Negative for chills and fever.  Respiratory:  Negative for shortness of breath.   Cardiovascular:  Negative for chest pain.  Gastrointestinal:  Positive for nausea and vomiting. Negative for constipation and diarrhea.  Neurological:  Positive for headaches. Negative for weakness and  light-headedness.    OBJECTIVE Patient Vitals for the past 24 hrs:  BP Temp Temp src Pulse Resp SpO2 Height Weight  03/20/22 1045 113/74 -- -- 69 18 -- -- --  03/20/22 1010 -- -- -- -- -- 100 % -- --  03/20/22 0930 114/70 -- -- 76 17 97 % -- --  03/20/22 0916 113/72 98.2 F (36.8 C) Oral 80 19 100 % 5' 3"  (1.6 m) 63.5 kg   Constitutional: Well-developed, well-nourished female in no distress.  Cardiovascular: normal rate Respiratory: normal rate and effort.  GI: Abd soft, non-tender, gravid appropriate for gestational age. Pos BS x 4 MS: Extremities nontender, no edema, normal ROM Neurologic: Alert and oriented x 4.   LAB RESULTS Results for orders placed or performed during the hospital encounter of 03/20/22 (from the past 24 hour(s))  Urinalysis, Routine w reflex microscopic Urine, Clean Catch     Status: Abnormal   Collection Time: 03/20/22  9:59 AM  Result Value Ref Range   Color, Urine STRAW (A) YELLOW   APPearance CLEAR CLEAR   Specific Gravity, Urine 1.009 1.005 - 1.030   pH 9.0 (H) 5.0 - 8.0   Glucose, UA NEGATIVE NEGATIVE mg/dL   Hgb urine dipstick NEGATIVE NEGATIVE   Bilirubin Urine NEGATIVE NEGATIVE   Ketones, ur 5 (A) NEGATIVE mg/dL   Protein, ur NEGATIVE NEGATIVE mg/dL   Nitrite NEGATIVE NEGATIVE   Leukocytes,Ua NEGATIVE NEGATIVE  CBC     Status: Abnormal   Collection Time: 03/20/22 10:27 AM  Result Value Ref Range   WBC 8.2 4.0 - 10.5 K/uL   RBC 3.31 (L) 3.87 - 5.11 MIL/uL   Hemoglobin 11.1 (L) 12.0 - 15.0 g/dL   HCT 32.2 (L) 36.0 - 46.0 %   MCV 97.3 80.0 - 100.0 fL   MCH 33.5 26.0 - 34.0 pg   MCHC 34.5 30.0 - 36.0 g/dL   RDW 13.2 11.5 - 15.5 %   Platelets 213 150 - 400 K/uL   nRBC 0.0 0.0 - 0.2 %  Basic metabolic panel     Status: Abnormal   Collection Time: 03/20/22 10:27 AM  Result Value Ref Range   Sodium 136 135 - 145 mmol/L   Potassium 3.6 3.5 - 5.1 mmol/L   Chloride 107 98 - 111 mmol/L   CO2 21 (L) 22 - 32 mmol/L   Glucose, Bld 69 (L) 70 - 99  mg/dL   BUN 5 (L) 6 - 20 mg/dL   Creatinine, Ser 0.43 (L) 0.44 - 1.00 mg/dL   Calcium 8.9 8.9 - 10.3 mg/dL   GFR, Estimated >60 >60 mL/min   Anion gap  8 5 - 15    IMAGING [place] MFM OB DETAIL +14 WK  Result Date: 03/09/2022 ----------------------------------------------------------------------  OBSTETRICS REPORT                       (Signed Final 03/09/2022 12:26 pm) ---------------------------------------------------------------------- Patient Info  ID #:       [id number]                          D.O.B.:  Mar 07, 2001 (21 yrs)  Name:       Dala Dock                Visit Date: 03/09/2022 11:07 am ---------------------------------------------------------------------- Performed By  Attending:        Johnell Comings MD         Ref. Address:     [place], [place]  Performed By:     Germain Osgood            [facility]  Referred By:      Chancy Milroy                    MD ---------------------------------------------------------------------- Orders  #  Description  Code        Ordered By  1  [place] MFM OB DETAIL +14 WK               [id number]    Baltazar Najjar ----------------------------------------------------------------------  #  Order #                     Accession #                Episode #  1  [id number]                   [id number]                 [id number] ---------------------------------------------------------------------- Indications  Fetal abnormality - other known or suspected   O35.9XX0  Other mental disorder complicating             O99.340   pregnancy, second trimester  Encounter for antenatal screening for          Z36.3  malformations  LR NIPS, Neg AFP  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 Cord Insertion:      Visualized, central  Amniotic Fluid  AFI FV:      Within normal limits                              Largest Pocket(cm)                              3.56 ---------------------------------------------------------------------- Biometry  BPD:      42.4  mm     G. Age:  18w 6d         37  %    CI:        70.49   %    70 - 86                                                          FL/HC:      17.3   %    16.1 - 18.3  HC:       161   mm     G. Age:  18w 6d         31  %    HC/AC:      1.22        1.09 - 1.39  AC:      132.3  mm     G. Age:  18w 5d         32  %    FL/BPD:     65.8   %  FL:  27.9  mm     G. Age:  18w 4d         22  %    FL/AC:      21.1   %    20 - 24  HUM:      29.7  mm     G. Age:  19w 5d         66  %  CER:      19.7  mm     G. Age:  19w 1d         40  %  NFT:       3.6  mm  LV:        5.8  mm  CM:          5  mm  Est. FW:     252  gm      0 lb 9 oz     21  % ---------------------------------------------------------------------- OB History  Gravidity:    2          SAB:   1  Living:       0 ---------------------------------------------------------------------- Gestational Age  LMP:           19w 0d        Date:  10/27/21                 EDD:   08/03/22  U/S Today:     18w 5d                                        EDD:   08/05/22  Best:          19w 1d     Det. By:  U/S C R L  (01/19/22)    EDD:   08/02/22 ---------------------------------------------------------------------- Anatomy  Cranium:                Appears normal         Aortic Arch:            Not well visualized  Cavum:                 Appears normal         Ductal Arch:            Not well visualized  Ventricles:            Appears normal         Diaphragm:              Appears normal  Choroid Plexus:        Appears normal         Stomach:                Appears normal, left                                                                        sided  Cerebellum:            Appears normal         Abdomen:  Appears normal  Posterior Fossa:       Appears normal         Abdominal Wall:         Appears nml (cord                                                                        insert, abd wall)  Nuchal Fold:           Appears normal         Cord Vessels:           Appears normal (3                                                                        vessel cord)  Face:                  Appears normal         Kidneys:                Appear normal                         (orbits and profile)  Lips:                  Appears normal         Bladder:                Appears normal  Thoracic:              Appears normal         Spine:                  Appears normal  Heart:                 Not well visualized    Upper Extremities:      Appears normal  RVOT:                  Not well visualized    Lower Extremities:      Appears normal  LVOT:                  Not well visualized  Other:  Fetus appears to be a female. Heels/feet and open hands/5th digits          visualized. Nasal bone, lenses, maxilla, mandible and falx visualized ---------------------------------------------------------------------- Cervix Uterus Adnexa  Cervix  Length:            3.1  cm.  Normal appearance by transabdominal scan.  Uterus  No abnormality visualized.  Right Ovary  Within normal limits.  Left Ovary  Within normal limits.  Cul De Sac  No free fluid seen.  Adnexa  No adnexal mass visualized. 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  She  denies any significant past medical history and denies  any problems in her current pregnancy.  She had a cell free DNA test earlier in her pregnancy which  indicated a low risk for trisomy 73, 28, and 13. A female fetus is  predicted.  She was informed that the fetal growth and amniotic fluid  level were appropriate for her gestational age.  There were no obvious fetal anomalies noted on today's  ultrasound exam.  However, the views of the fetal anatomy  were limited today due to the fetal position.  The patient was informed that anomalies may be missed due  to technical limitations. If the fetus is in a suboptimal position  or maternal habitus is increased, visualization of the fetus in  the maternal uterus may be impaired.  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  Her partner should be screened to determine if he is also a  carrier for SMA.  She understands that definitive diagnosis of SMA can be  made via an amniocentesis.  She will consider the procedure  if her partner is also a carrier for SMA.  A follow-up exam was scheduled in 4 weeks to complete the  views of the fetal anatomy. ----------------------------------------------------------------------                   Johnell Comings, MD Electronically Signed Final Report   03/09/2022 12:26 pm ----------------------------------------------------------------------   MAU COURSE Orders Placed This Encounter  Procedures   Urinalysis, Routine w reflex microscopic Urine, Clean Catch   CBC   Basic metabolic panel   Insert peripheral IV   Discharge patient   Meds ordered this encounter  Medications   lactated ringers bolus 1,000 mL   haloperidol lactate (HALDOL) injection 2 mg   diphenhydrAMINE (BENADRYL) injection 25 mg    promethazine (PHENERGAN) 12.5 MG tablet    Sig: Take 1 tablet (12.5 mg total) by mouth every 6 (six) hours as needed for nausea or vomiting.    Dispense:  30 tablet    Refill:  0    Order Specific Question:   Supervising Provider    Answer:   Janyth Pupa [[id number]]    MDM Pt presented to MAU with nausea and vomiting of pregnancy. Endorses recent marijuana use 4 days ago. No other concerning symptoms. Given sxs and recent marijuana use, IV Haldol and Benadryl given with IV LR bolus. UA with +ketones. CBC with Hgb 11.1, Hct 32.2. BMP without electrolyte derangement. Patient symptoms resolved with fluids and medication, she is now tolerated PO intake. Stable for discharge home.   ASSESSMENT Pt presented with nausea and vomiting of pregnancy with recent marijuana use. IV fluids and medications improved symptoms.   PLAN Discharge home in stable condition. Continue home Diclegis and start Phenergan as needed for acute episodes of nausea/vomiting.  Liquid diet for 24 hours and advance diet as tolerated after this time.   Allergies as of 03/20/2022   No Known Allergies      Medication List     TAKE these medications    Blood Pressure Kit Devi 1 kit by Does not apply route once a week.   Doxylamine-Pyridoxine 10-10 MG Tbec Commonly known as: Diclegis Take 2 tablets by mouth daily. Start with 2 delayed-release tablets by mouth at bedtime. If symptoms not adequately controlled, increase dose to 4 tablets each day (1 tab in AM, 1 tab mid-afternoon,  and 2 tabs at bedtime)   Gojji Weight Scale Misc 1 Device by Does not apply route every 30 (thirty) days.   loratadine 10 MG tablet Commonly known as: Claritin Take 1 tablet (10 mg total) by mouth daily.   promethazine 12.5 MG tablet Commonly known as: PHENERGAN Take 1 tablet (12.5 mg total) by mouth every 6 (six) hours as needed for nausea or vomiting.   terconazole 0.8 % vaginal cream Commonly known as: TERAZOL 3 Place 1 applicator  vaginally at bedtime.   Vitafol Ultra 29-0.6-0.4-200 MG Caps Take 1 capsule by mouth daily before breakfast.         Clearence Ped, Student-PA 03/20/2022  11:40 AM   CNM attestation:  Attestation of Supervision of Student:  I confirm that I have verified the information documented in the physician assistant student's note and that I have also personally reperformed the history, physical exam and all medical decision making activities.  I have verified that all services and findings are accurately documented in this student's note; and I agree with management and plan as outlined in the documentation. I have also made any necessary editorial changes.  --Recurrent vomiting without associated signs of increasing acuity --Cannabis Hyperemesis, abstinence advised  Darlina Rumpf, [place] for [employer], [employer] 03/21/2022 1:37 PM   Rendy Lazard is a 21 y.o. G2P0010 female reporting new onset recurrent vomiting 2/2 THC use this morning.   Associated symptoms:  + GI complaints + abdominal pain with walking and during episodes of vomiting Neg vaginal bleeding Neg vaginal discharge Neg urinary complaints Neg fever and chills  PE:  Gen: calm comfortable, NAD Resp: normal effort, no distress Heart: Regular rate Abd: Soft, NT, Pos BS x 4 Neuro: A&O x 4 Pelvic exam: Deferred due to chief complaint  ROS, labs, PMH reviewed  Orders Placed This Encounter  Procedures   Urinalysis, Routine w reflex microscopic Urine, Clean Catch   CBC   Basic metabolic panel   Discharge patient   Meds ordered this encounter  Medications   lactated ringers bolus 1,000 mL   haloperidol lactate (HALDOL) injection 2 mg   diphenhydrAMINE (BENADRYL) injection 25 mg   promethazine (PHENERGAN) 12.5 MG tablet    Sig: Take 1 tablet (12.5 mg total) by mouth every 6 (six) hours as needed for nausea or vomiting.    Dispense:  30 tablet    Refill:  0    Order  Specific Question:   Supervising Provider    Answer:   Janyth Pupa [[id number]]    MDM --FHT 150 by Doppler --Patient tolerating PO solid and liquid prior to discharge --Reviewed impact of Cannabis on PO intake tolerance, will recur and prescribed medications may not be able to help is use restarts --New outpatient regimen: intake guidelines and Phenergan  Assessment 1. Supervision of normal first pregnancy in second trimester   2. 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 [redacted] weeks gestation of pregnancy     Plan: - Discharge home in stable condition - Follow-up as scheduled at your doctor's office or sooner as needed if symptoms worsen. - Return to maternity admissions symptoms worsen  Mallie Snooks, [place], MSN, CNM 03/21/2022 1:37 PM

## 2022-03-20 NOTE — MAU Note (Signed)
...  Kristine Franco is a 21 y.o. at [redacted]w[redacted]d here in MAU reporting: N/V upon wakening this morning. She reports she has been experiencing N/V for her entire pregnancy and takes medication. She reports her medication "usually works." She is endorsing constant sharp bilateral pain that "switches sides" that began after her initial emesis episode. Denies VB or LOF. Reports feeling occasional fetal movement.  Last doses: Unsure of nausea medication she took but reports she has not taken it since yesterday afternoon.  Last PO: 0830 - Gatorade on the way to the hospital. Has not thrown this up yet. 0100 - leftovers - seafood boil  Pain score:  6/10 bilateral   FHT: 150 doppler Lab orders placed from triage: UA

## 2022-03-25 ENCOUNTER — Ambulatory Visit (INDEPENDENT_AMBULATORY_CARE_PROVIDER_SITE_OTHER): Payer: Medicaid Other | Admitting: Obstetrics and Gynecology

## 2022-03-25 ENCOUNTER — Other Ambulatory Visit (HOSPITAL_COMMUNITY)
Admission: RE | Admit: 2022-03-25 | Discharge: 2022-03-25 | Disposition: A | Discharge status: Home / Self Care | Payer: Medicaid Other | Source: Ambulatory Visit | Attending: Student | Admitting: Student

## 2022-03-25 ENCOUNTER — Encounter: Payer: Self-pay | Admitting: Obstetrics and Gynecology

## 2022-03-25 VITALS — BP 110/69 | HR 71 | Wt 147.0 lb

## 2022-03-25 DIAGNOSIS — R8271 Bacteriuria: Secondary | ICD-10-CM | POA: Insufficient documentation

## 2022-03-25 DIAGNOSIS — Z3402 Encounter for supervision of normal first pregnancy, second trimester: Secondary | ICD-10-CM

## 2022-03-25 NOTE — Progress Notes (Signed)
Subjective:  Kristine Franco is a 21 y.o. G2P0010 at [redacted]w[redacted]d being seen today for ongoing prenatal care.  She is currently monitored for the following issues for this low-risk pregnancy and has MDD (major depressive disorder), recurrent severe, without psychosis (HCC); MDD (major depressive disorder), recurrent, severe, with psychosis (HCC); Suicidal ideation; Severe recurrent major depression without psychotic features (HCC); Cannabis use disorder, mild, abuse; Acetaminophen overdose, intentional self-harm, initial encounter (HCC); Anxiety; Suicidal behavior with attempted self-injury (HCC); Suicide attempt [facility]); MDD (major depressive disorder), severe (HCC); Supervision of normal first pregnancy in second trimester; and GBS bacteriuria on their problem list.  Patient reports vaginal irritation.  Contractions: Not present. Vag. Bleeding: None.  Movement: Present. Denies leaking of fluid.   The following portions of the patient's history were reviewed and updated as appropriate: allergies, current medications, past family history, past medical history, past social history, past surgical history and problem list. Problem list updated.  Objective:   Vitals:   03/25/22 1454  BP: 110/69  Pulse: 71  Weight: 147 lb (66.7 kg)    Fetal Status: Fetal Heart Rate (bpm): 150   Movement: Present     General:  Alert, oriented and cooperative. Patient is in no acute distress.  Skin: Skin is warm and dry. No rash noted.   Cardiovascular: Normal heart rate noted  Respiratory: Normal respiratory effort, no problems with respiration noted  Abdomen: Soft, gravid, appropriate for gestational age. Pain/Pressure: Absent     Pelvic:  Cervical exam performed      Small left labial skin tag, vaginal and HSV swab obtained  Extremities: Normal range of motion.  Edema: Trace  Mental Status: Normal mood and affect. Normal behavior. Normal judgment and thought content.   Urinalysis:      Assessment and Plan:   Pregnancy: G2P0010 at [redacted]w[redacted]d  1. Supervision of normal first pregnancy in second trimester Stable Await swab and culture results. If negative pt desires removal Growth scan later this month  2. GBS bacteriuria Tx while in labor  Preterm labor symptoms and general obstetric precautions including but not limited to vaginal bleeding, contractions, leaking of fluid and fetal movement were reviewed in detail with the patient. Please refer to After Visit Summary for other counseling recommendations.  Return in about 4 weeks (around 04/22/2022) for OB visit, face to face, any provider.   Hermina Staggers, MD

## 2022-03-25 NOTE — Progress Notes (Signed)
ROB 21.2 wks  Reports vagina is painful when wiping. Denies vag discharge/ odor.

## 2022-03-25 NOTE — Patient Instructions (Signed)

## 2022-03-26 LAB — CERVICOVAGINAL ANCILLARY ONLY
Bacterial Vaginitis (gardnerella): NEGATIVE
Candida Glabrata: NEGATIVE
Candida Vaginitis: POSITIVE — AB
Chlamydia: NEGATIVE
Comment: NEGATIVE
Comment: NEGATIVE
Comment: NEGATIVE
Comment: NEGATIVE
Comment: NEGATIVE
Comment: NORMAL
Neisseria Gonorrhea: NEGATIVE
Trichomonas: NEGATIVE

## 2022-03-27 ENCOUNTER — Other Ambulatory Visit: Payer: Self-pay | Admitting: Emergency Medicine

## 2022-03-27 DIAGNOSIS — B379 Candidiasis, unspecified: Secondary | ICD-10-CM

## 2022-03-27 MED ORDER — TERCONAZOLE 0.8 % VA CREA: 1.0000 | TOPICAL_CREAM | Freq: Every day | VAGINAL | 0 refills | Status: DC

## 2022-03-27 NOTE — Progress Notes (Signed)
Rx sent to pharmacy   

## 2022-03-28 LAB — HERPES SIMPLEX VIRUS CULTURE

## 2022-04-06 ENCOUNTER — Ambulatory Visit: Payer: Medicaid Other

## 2022-04-21 ENCOUNTER — Encounter: Payer: Self-pay | Admitting: Certified Nurse Midwife

## 2022-04-23 ENCOUNTER — Ambulatory Visit (INDEPENDENT_AMBULATORY_CARE_PROVIDER_SITE_OTHER): Payer: Medicaid Other | Admitting: Certified Nurse Midwife

## 2022-04-23 VITALS — BP 116/73 | HR 81 | Wt 149.0 lb

## 2022-04-23 DIAGNOSIS — Z3402 Encounter for supervision of normal first pregnancy, second trimester: Secondary | ICD-10-CM

## 2022-04-23 DIAGNOSIS — Z3A25 25 weeks gestation of pregnancy: Secondary | ICD-10-CM

## 2022-04-23 DIAGNOSIS — Z3492 Encounter for supervision of normal pregnancy, unspecified, second trimester: Secondary | ICD-10-CM

## 2022-04-23 DIAGNOSIS — L989 Disorder of the skin and subcutaneous tissue, unspecified: Secondary | ICD-10-CM

## 2022-04-23 NOTE — Progress Notes (Signed)
ROB, c/o pain 4/10 right inner thigh x 1 day.

## 2022-04-23 NOTE — Patient Instructions (Signed)

## 2022-04-26 NOTE — Progress Notes (Signed)
   PRENATAL VISIT NOTE  Subjective:  Kristine Franco is a 21 y.o. G2P0010 at [redacted]w[redacted]d being seen today for ongoing prenatal care.  She is currently monitored for the following issues for this low-risk pregnancy and has Cannabis use disorder, mild, abuse; Anxiety; History of suicide attempt; Supervision of normal first pregnancy in second trimester; and GBS bacteriuria on their problem list.  Patient reports  some kind of bump or lesion on her right inner thigh, noticed it hurting today. Noticed it at work then came home, showered and shaved her legs/groin area .  Contractions: Not present. Vag. Bleeding: None.  Movement: Present. Denies leaking of fluid.   The following portions of the patient's history were reviewed and updated as appropriate: allergies, current medications, past family history, past medical history, past social history, past surgical history and problem list.   Objective:   Vitals:   04/23/22 1546  BP: 116/73  Pulse: 81  Weight: 149 lb (67.6 kg)   Fetal Status: Fetal Heart Rate (bpm): 154   Movement: Present     General:  Alert, oriented and cooperative. Patient is in no acute distress.  Skin: Skin is warm and dry. No rash noted.   Cardiovascular: Normal heart rate noted  Respiratory: Normal respiratory effort, no problems with respiration noted  Abdomen: Soft, gravid, appropriate for gestational age.  Pain/Pressure: Absent     Pelvic: Cervical exam deferred        Extremities: Normal range of motion.  Edema: Trace  Mental Status: Normal mood and affect. Normal behavior. Normal judgment and thought content.   Assessment and Plan:  Pregnancy: G2P0010 at [redacted]w[redacted]d 1. Encounter for supervision of low-risk pregnancy in second trimester - Doing well, feeling regular and vigorous fetal movement   2. [redacted] weeks gestation of pregnancy - Routine OB care - Pt is interested in waterbirth.  No contraindications at this time per chart review/patient assessment.   - Pt to enroll in  class, see CNMs for most visits in the office.  - Discussed waterbirth as option for low-risk pregnancy.  Reviewed conditions that may arise during pregnancy that will risk pt out of waterbirth including hypertension, diabetes, fetal growth restriction <10%ile, etc.  4. Skin lesion - See photo loaded into media, likely contact irritation from leggings worn at work while hot - but also suspicious for HSV lesion since it is tender.  - Herpes simplex virus culture  Preterm labor symptoms and general obstetric precautions including but not limited to vaginal bleeding, contractions, leaking of fluid and fetal movement were reviewed in detail with the patient. Please refer to After Visit Summary for other counseling recommendations.   Return in about 3 weeks (around 05/14/2022) for IN-PERSON, LOB/GTT.  Future Appointments  Date Time Provider Department Center  05/04/2022 12:30 PM [facility] NURSE [facility]  05/04/2022 12:45 PM [facility] US4 [facility]  05/14/2022  8:30 AM [facility] LAB [facility] None  05/14/2022  8:55 AM Stinson, Rhona Raider, DO [facility] None    Bernerd Limbo, CNM

## 2022-04-27 LAB — HERPES SIMPLEX VIRUS CULTURE

## 2022-05-04 ENCOUNTER — Ambulatory Visit: Payer: Medicaid Other | Attending: Obstetrics

## 2022-05-04 ENCOUNTER — Ambulatory Visit: Payer: Medicaid Other | Admitting: *Deleted

## 2022-05-04 VITALS — BP 117/66 | HR 70

## 2022-05-04 DIAGNOSIS — O99322 Drug use complicating pregnancy, second trimester: Secondary | ICD-10-CM

## 2022-05-04 DIAGNOSIS — O99320 Drug use complicating pregnancy, unspecified trimester: Secondary | ICD-10-CM

## 2022-05-04 DIAGNOSIS — O99342 Other mental disorders complicating pregnancy, second trimester: Secondary | ICD-10-CM | POA: Diagnosis not present

## 2022-05-04 DIAGNOSIS — Z148 Genetic carrier of other disease: Secondary | ICD-10-CM | POA: Diagnosis present

## 2022-05-04 DIAGNOSIS — F333 Major depressive disorder, recurrent, severe with psychotic symptoms: Secondary | ICD-10-CM | POA: Diagnosis not present

## 2022-05-04 DIAGNOSIS — R45851 Suicidal ideations: Secondary | ICD-10-CM | POA: Diagnosis not present

## 2022-05-04 DIAGNOSIS — O358XX0 Maternal care for other (suspected) fetal abnormality and damage, not applicable or unspecified: Secondary | ICD-10-CM

## 2022-05-04 DIAGNOSIS — F1290 Cannabis use, unspecified, uncomplicated: Secondary | ICD-10-CM | POA: Insufficient documentation

## 2022-05-04 DIAGNOSIS — O285 Abnormal chromosomal and genetic finding on antenatal screening of mother: Secondary | ICD-10-CM

## 2022-05-04 DIAGNOSIS — Z3A27 27 weeks gestation of pregnancy: Secondary | ICD-10-CM

## 2022-05-14 ENCOUNTER — Other Ambulatory Visit: Payer: Medicaid Other

## 2022-05-14 ENCOUNTER — Ambulatory Visit (INDEPENDENT_AMBULATORY_CARE_PROVIDER_SITE_OTHER): Payer: Medicaid Other | Admitting: Family Medicine

## 2022-05-14 VITALS — BP 118/72 | HR 78 | Wt 154.9 lb

## 2022-05-14 DIAGNOSIS — Z23 Encounter for immunization: Secondary | ICD-10-CM | POA: Diagnosis not present

## 2022-05-14 DIAGNOSIS — Z3402 Encounter for supervision of normal first pregnancy, second trimester: Secondary | ICD-10-CM

## 2022-05-14 DIAGNOSIS — Z3A28 28 weeks gestation of pregnancy: Secondary | ICD-10-CM | POA: Diagnosis not present

## 2022-05-14 MED ORDER — DOXYLAMINE-PYRIDOXINE 10-10 MG PO TBEC: 2.0000 | DELAYED_RELEASE_TABLET | Freq: Every day | ORAL | 5 refills | Status: DC

## 2022-05-14 NOTE — Progress Notes (Signed)
   PRENATAL VISIT NOTE  Subjective:  Kristine Franco is a 21 y.o. G2P0010 at [redacted]w[redacted]d being seen today for ongoing prenatal care.  She is currently monitored for the following issues for this low-risk pregnancy and has Cannabis use disorder, mild, abuse; Anxiety; History of suicide attempt; Supervision of normal first pregnancy in second trimester; and GBS bacteriuria on their problem list.  Patient reports no complaints.  Contractions: Not present. Vag. Bleeding: None.  Movement: Present. Denies leaking of fluid.   The following portions of the patient's history were reviewed and updated as appropriate: allergies, current medications, past family history, past medical history, past social history, past surgical history and problem list.   Objective:   Vitals:   05/14/22 0839  BP: 118/72  Pulse: 78  Weight: 154 lb 14.4 oz (70.3 kg)    Fetal Status: Fetal Heart Rate (bpm): 143 Fundal Height: 28 cm Movement: Present     General:  Alert, oriented and cooperative. Patient is in no acute distress.  Skin: Skin is warm and dry. No rash noted.   Cardiovascular: Normal heart rate noted  Respiratory: Normal respiratory effort, no problems with respiration noted  Abdomen: Soft, gravid, appropriate for gestational age.  Pain/Pressure: Absent     Pelvic: Cervical exam deferred        Extremities: Normal range of motion.  Edema: Trace  Mental Status: Normal mood and affect. Normal behavior. Normal judgment and thought content.   Assessment and Plan:  Pregnancy: G2P0010 at [redacted]w[redacted]d 1. Supervision of normal first pregnancy in second trimester FHT and FH normal Check CBC Desires circumcision for baby. Plans on getting on nexplanon - Glucose Tolerance, 2 Hours w/1 Hour - CBC - HIV Antibody (routine testing w rflx) - RPR - Tdap vaccine greater than or equal to 7yo IM - Doxylamine-Pyridoxine (DICLEGIS) 10-10 MG TBEC; Take 2 tablets by mouth at bedtime. If symptoms persist, add one tablet in the  morning and one in the afternoon  Dispense: 100 tablet; Refill: 5  Preterm labor symptoms and general obstetric precautions including but not limited to vaginal bleeding, contractions, leaking of fluid and fetal movement were reviewed in detail with the patient. Please refer to After Visit Summary for other counseling recommendations.   No follow-ups on file.  No future appointments.  Levie Heritage, DO

## 2022-05-14 NOTE — Progress Notes (Signed)
Patient presents for ROB. Patient has no concerns today. 

## 2022-05-15 ENCOUNTER — Encounter (HOSPITAL_COMMUNITY): Payer: Self-pay | Admitting: Obstetrics and Gynecology

## 2022-05-15 ENCOUNTER — Inpatient Hospital Stay (HOSPITAL_COMMUNITY)
Admission: AD | Admit: 2022-05-15 | Discharge: 2022-05-15 | Disposition: A | Discharge status: Home / Self Care | Payer: Medicaid Other | Attending: Obstetrics and Gynecology | Admitting: Obstetrics and Gynecology

## 2022-05-15 DIAGNOSIS — R109 Unspecified abdominal pain: Secondary | ICD-10-CM | POA: Insufficient documentation

## 2022-05-15 DIAGNOSIS — O4703 False labor before 37 completed weeks of gestation, third trimester: Secondary | ICD-10-CM | POA: Insufficient documentation

## 2022-05-15 DIAGNOSIS — O23593 Infection of other part of genital tract in pregnancy, third trimester: Secondary | ICD-10-CM | POA: Diagnosis not present

## 2022-05-15 DIAGNOSIS — O479 False labor, unspecified: Secondary | ICD-10-CM | POA: Diagnosis not present

## 2022-05-15 DIAGNOSIS — Z3A28 28 weeks gestation of pregnancy: Secondary | ICD-10-CM | POA: Diagnosis not present

## 2022-05-15 DIAGNOSIS — Z87891 Personal history of nicotine dependence: Secondary | ICD-10-CM | POA: Insufficient documentation

## 2022-05-15 DIAGNOSIS — O26893 Other specified pregnancy related conditions, third trimester: Secondary | ICD-10-CM | POA: Diagnosis present

## 2022-05-15 DIAGNOSIS — B3731 Acute candidiasis of vulva and vagina: Secondary | ICD-10-CM | POA: Insufficient documentation

## 2022-05-15 LAB — CBC
Hematocrit: 33.1 % — ABNORMAL LOW (ref 34.0–46.6)
Hemoglobin: 11.1 g/dL (ref 11.1–15.9)
MCH: 32.5 pg (ref 26.6–33.0)
MCHC: 33.5 g/dL (ref 31.5–35.7)
MCV: 97 fL (ref 79–97)
Platelets: 196 10*3/uL (ref 150–450)
RBC: 3.42 x10E6/uL — ABNORMAL LOW (ref 3.77–5.28)
RDW: 12.4 % (ref 11.7–15.4)
WBC: 8.5 10*3/uL (ref 3.4–10.8)

## 2022-05-15 LAB — WET PREP, GENITAL
Clue Cells Wet Prep HPF POC: NONE SEEN
Sperm: NONE SEEN
Trich, Wet Prep: NONE SEEN
WBC, Wet Prep HPF POC: >=10 — AB (ref <10)

## 2022-05-15 LAB — GLUCOSE TOLERANCE, 2 HOURS W/ 1HR
Glucose, 1 hour: 125 mg/dL (ref 70–179)
Glucose, 2 hour: 95 mg/dL (ref 70–152)
Glucose, Fasting: 73 mg/dL (ref 70–91)

## 2022-05-15 LAB — HIV ANTIBODY (ROUTINE TESTING W REFLEX): HIV Screen 4th Generation wRfx: NONREACTIVE

## 2022-05-15 LAB — RPR: RPR Ser Ql: NONREACTIVE

## 2022-05-15 LAB — AMNISURE RUPTURE OF MEMBRANE (ROM) NOT AT ARMC: Amnisure ROM: NEGATIVE

## 2022-05-15 MED ORDER — LACTATED RINGERS IV BOLUS
1000.0000 mL | Freq: Once | INTRAVENOUS | Status: AC
  Administered 2022-05-15: 1000 mL via INTRAVENOUS

## 2022-05-15 MED ORDER — NIFEDIPINE 10 MG PO CAPS
10.0000 mg | ORAL_CAPSULE | ORAL | Status: AC | PRN
  Administered 2022-05-15 (×3): 10 mg via ORAL
  Filled 2022-05-15 (×3): qty 1

## 2022-05-15 MED ORDER — FLUCONAZOLE 150 MG PO TABS
ORAL_TABLET | ORAL | 0 refills | Status: DC
Start: 2022-05-15 — End: 2022-06-27

## 2022-05-15 NOTE — MAU Note (Signed)
.  Kristine Franco is a 21 y.o. at [redacted]w[redacted]d here in MAU reporting: lower abdominal pain that started around 1550. Patient states she also felt a gush of fluid around that time. Denies VB. Reports DFM today.   Pain score: 10

## 2022-05-15 NOTE — Discharge Instructions (Signed)
Reasons to return to MAU at [facility]:  Since you are preterm, return to MAU if:  1.  Contractions are 10 minutes apart or less and they becoming more uncomfortable or painful over time 2.  You have a large gush of fluid, or a trickle of fluid that will not stop and you have to wear a pad 3.  You have bleeding that is bright red, heavier than spotting--like menstrual bleeding (spotting can be normal in early labor or after a check of your cervix) 4.  You do not feel the baby moving like he/she normally does  

## 2022-05-15 NOTE — MAU Provider Note (Signed)
Chief Complaint:  Abdominal Pain   Event Date/Time   First Provider Initiated Contact with Patient 05/15/22 1629      HPI: Kristine Franco is a 21 y.o. G2P0010 at 82w4dby LMP who presents to maternity admissions via EMS reporting severe abdominal pain, gush of fluid at 1:50 pm, and pelvic pressure and urge to push. She reports feeling less fetal movement today than usual.  There is no vaginal bleeding.  She reports the fluid was enough to soak her underwear but not require a pantyliner or pad. She was treated for yeast infection 2+ weeks ago. She denies any itching/burning/odor.   HPI  Past Medical History: Past Medical History:  Diagnosis Date   [place]   Anxiety    Asthma 2019   Depression    [place] severe[place]   MD[place]   Medical history non-contributory     Past obstetric history: OB History  Gravida Para Term Preterm AB Living  2       1    SAB IAB Ectopic Multiple Live Births  1            # Outcome Date GA Lbr Len/2nd Weight Sex Delivery Anes PTL Lv  2 Current           1 SAB 08/18/17            Past Surgical History: Past Surgical History:  Procedure Laterality Date   INCISION AND DRAINAGE OF PERITONSILLAR ABCESS N/A 06/23/2017   Procedure: INCISION AND DRAINAGE OF PERITONSILLAR ABCESS;  Surgeon: MHelayne Seminole MD;  [place]  Service: ENT;  Laterality: N/A;    Family History: Family History  Problem Relation Age of Onset   Hypertension Mother    Diabetes Maternal Grandmother    Diabetes Maternal Grandfather     Social History: Social History   Tobacco Use   Smoking status: Former    Types: Cigars    Quit date: 12/16/2021    Years since quitting: 0.4   Smokeless tobacco: Never   Tobacco comments:    black & milds socially  Vaping Use   Vaping Use:  Former   Quit date: 12/16/2021   Substances: Nicotine, Flavoring  Substance Use Topics   Alcohol use: Not Currently    Comment: social, prior to pregnancy   Drug use: Yes    Types: Marijuana    Comment: Pt states she smokes when shes in pain    Allergies: No Known Allergies  Meds:  Medications Prior to Admission  Medication Sig Dispense Refill Last Dose   Blood Pressure Monitoring (BLOOD PRESSURE KIT) DEVI 1 kit by Does not apply route once a week. 1 each 0    Doxylamine-Pyridoxine (DICLEGIS) 10-10 MG TBEC Take 2 tablets by mouth daily. Start with 2 delayed-release tablets by mouth at bedtime. If symptoms not adequately controlled, increase dose to 4 tablets each day (1 tab in AM, 1 tab mid-afternoon, and 2 tabs at bedtime) (Patient not taking: Reported on 05/14/2022) 60 tablet 0    Doxylamine-Pyridoxine (DICLEGIS) 10-10 MG TBEC Take 2 tablets by mouth at bedtime. If symptoms persist, add one tablet in the morning and one in the afternoon 100 tablet 5    loratadine (CLARITIN) 10 MG tablet Take 1 tablet (10 mg total) by mouth daily. 30 tablet 11    Misc. Devices (GOJJI WEIGHT SCALE) MISC 1 Device by  Does not apply route every 30 (thirty) days. 1 each 0    Prenat-Fe Poly-Methfol-FA-DHA (VITAFOL ULTRA) 29-0.6-0.4-200 MG CAPS Take 1 capsule by mouth daily before breakfast. 90 capsule 4    promethazine (PHENERGAN) 12.5 MG tablet Take 1 tablet (12.5 mg total) by mouth every 6 (six) hours as needed for nausea or vomiting. 30 tablet 0     ROS:  Review of Systems  Constitutional:  Negative for chills, fatigue and fever.  Eyes:  Negative for visual disturbance.  Respiratory:  Negative for shortness of breath.   Cardiovascular:  Negative for chest pain.  Gastrointestinal:  Positive for abdominal pain. Negative for nausea and vomiting.  Genitourinary:  Positive for vaginal discharge. Negative for difficulty urinating, dysuria, flank pain, pelvic pain, vaginal bleeding and vaginal pain.   Neurological:  Negative for dizziness and headaches.  Psychiatric/Behavioral: Negative.       I have reviewed patient's Past Medical Hx, Surgical Hx, Family Hx, Social Hx, medications and allergies.   Physical Exam  Patient Vitals for the past 24 hrs:  BP Temp Temp src Pulse Resp SpO2  05/15/22 1724 109/61 -- -- 99 14 100 %  05/15/22 1702 104/60 -- -- (!) 101 -- --  05/15/22 1640 123/79 -- -- 79 -- --  05/15/22 1505 112/65 98.1 F (36.7 C) Oral 81 14 99 %   Constitutional: Well-developed, well-nourished female in no acute distress.  Cardiovascular: normal rate Respiratory: normal effort GI: Abd soft, non-tender, gravid appropriate for gestational age.  MS: Extremities nontender, no edema, normal ROM Neurologic: Alert and oriented x 4.  GU: Neg CVAT.  PELVIC EXAM: Cervix pink, visually closed, without lesion, scant white creamy discharge, vaginal walls and external genitalia normal Bimanual exam: Cervix 0/long/high, firm, anterior, neg CMT, uterus nontender, nonenlarged, adnexa without tenderness, enlargement, or mass  Dilation: Closed Effacement (%): Thick Cervical Position: Posterior Exam by:: Fatima Blank, CNM  FHT:  Baseline 145 , moderate variability, accelerations present, no decelerations Contractions: irregular, every 5-10 minutes, mild to palpation   Labs: Results for orders placed or performed during the hospital encounter of 05/15/22 (from the past 24 hour(s))  Wet prep, genital     Status: Abnormal   Collection Time: 05/15/22  3:01 PM   Specimen: Vaginal  Result Value Ref Range   Yeast Wet Prep HPF POC PRESENT (A) NONE SEEN   Trich, Wet Prep NONE SEEN NONE SEEN   Clue Cells Wet Prep HPF POC NONE SEEN NONE SEEN   WBC, Wet Prep HPF POC >=10 (A) <10   Sperm NONE SEEN   Amnisure rupture of membrane (rom)not at [facility]     Status: None   Collection Time: 05/15/22  3:30 PM  Result Value Ref Range   Amnisure ROM NEGATIVE    O/Positive/-- (04/12  1410)  Imaging:   MAU Course/MDM: Orders Placed This Encounter  Procedures   Wet prep, genital   Amnisure rupture of membrane (rom)not at [facility]   Discharge patient    Meds ordered this encounter  Medications   lactated ringers bolus 1,000 mL   NIFEdipine (PROCARDIA) capsule 10 mg   fluconazole (DIFLUCAN) 150 MG tablet    Sig: Take one tablet now and one in 3 days.    Dispense:  2 tablet    Refill:  0    Order Specific Question:   Supervising Provider    Answer:   CONSTANT, PEGGY [[id number]]     NST reviewed and reactive Cervix closed/thick so no evidence of PTL Wet  prep positive for yeast, c/w clinical exam Pt feeling more fetal movement in MAU Procardia given for contractions, resolved on toco and significantly improved per pt D/C home with Diflucan x 2 tabs, one now, one in 72 hours F/U at [facility] as scheduled Return to MAU as needed for emergencies   Assessment: 1. Braxton Hicks contractions   2. Abdominal pain during pregnancy in third trimester   3. [redacted] weeks gestation of pregnancy   4. Vagina, candidiasis     Plan: Discharge home Labor precautions and fetal kick counts  Follow-up Information     [facility] Follow up.   Why: As scheduled Contact information: [place] [id number] [facility] up.   Specialty: Obstetrics and Gynecology Contact information: [address] [id number] [place] [phone]               Allergies as of 05/15/2022   No Known Allergies      Medication List     TAKE these medications    Blood Pressure Kit Devi 1 kit by Does not apply route once a week.   Doxylamine-Pyridoxine 10-10 MG Tbec Commonly known as: Diclegis Take 2 tablets by mouth daily. Start with 2 delayed-release tablets by mouth at bedtime. If symptoms not adequately controlled, increase dose to 4 tablets each  day (1 tab in AM, 1 tab mid-afternoon, and 2 tabs at bedtime)   Doxylamine-Pyridoxine 10-10 MG Tbec Commonly known as: Diclegis Take 2 tablets by mouth at bedtime. If symptoms persist, add one tablet in the morning and one in the afternoon   fluconazole 150 MG tablet Commonly known as: DIFLUCAN Take one tablet now and one in 3 days.   Gojji Weight Scale Misc 1 Device by Does not apply route every 30 (thirty) days.   loratadine 10 MG tablet Commonly known as: Claritin Take 1 tablet (10 mg total) by mouth daily.   promethazine 12.5 MG tablet Commonly known as: PHENERGAN Take 1 tablet (12.5 mg total) by mouth every 6 (six) hours as needed for nausea or vomiting.   Vitafol Ultra 29-0.6-0.4-200 MG Caps Take 1 capsule by mouth daily before breakfast.        Fatima Blank Certified Nurse-Midwife 05/15/2022 6:04 PM

## 2022-05-18 LAB — GC/CHLAMYDIA PROBE AMP (~~LOC~~) NOT AT ARMC
Chlamydia: NEGATIVE
Comment: NEGATIVE
Comment: NORMAL
Neisseria Gonorrhea: NEGATIVE

## 2022-06-03 ENCOUNTER — Other Ambulatory Visit (HOSPITAL_COMMUNITY)
Admission: RE | Admit: 2022-06-03 | Discharge: 2022-06-03 | Disposition: A | Discharge status: Home / Self Care | Payer: Medicaid Other | Source: Ambulatory Visit | Attending: Obstetrics and Gynecology | Admitting: Obstetrics and Gynecology

## 2022-06-03 ENCOUNTER — Ambulatory Visit (INDEPENDENT_AMBULATORY_CARE_PROVIDER_SITE_OTHER): Payer: Medicaid Other | Admitting: Student

## 2022-06-03 VITALS — BP 115/71 | HR 71 | Wt 156.1 lb

## 2022-06-03 DIAGNOSIS — O26893 Other specified pregnancy related conditions, third trimester: Secondary | ICD-10-CM

## 2022-06-03 DIAGNOSIS — Z3402 Encounter for supervision of normal first pregnancy, second trimester: Secondary | ICD-10-CM

## 2022-06-03 DIAGNOSIS — N898 Other specified noninflammatory disorders of vagina: Secondary | ICD-10-CM | POA: Diagnosis present

## 2022-06-03 DIAGNOSIS — Z3403 Encounter for supervision of normal first pregnancy, third trimester: Secondary | ICD-10-CM

## 2022-06-03 DIAGNOSIS — R8271 Bacteriuria: Secondary | ICD-10-CM

## 2022-06-03 DIAGNOSIS — Z3A31 31 weeks gestation of pregnancy: Secondary | ICD-10-CM

## 2022-06-03 NOTE — Progress Notes (Signed)
Patient presents for ROB at [redacted]w[redacted]d. Reports +FM, Denies ctx, LOF, vaginal bleeding.

## 2022-06-03 NOTE — Progress Notes (Signed)
   PRENATAL VISIT NOTE  Subjective:  Kristine Franco is a 21 y.o. G2P0010 at [redacted]w[redacted]d being seen today for ongoing prenatal care.  She is currently monitored for the following issues for this low-risk pregnancy and has Cannabis use disorder, mild, abuse; Anxiety; History of suicide attempt; Supervision of normal first pregnancy in second trimester; and GBS bacteriuria on their problem list.  Patient reports vaginal irritation.  Contractions: Not present. Vag. Bleeding: None.  Movement: Present. Denies leaking of fluid.   The following portions of the patient's history were reviewed and updated as appropriate: allergies, current medications, past family history, past medical history, past social history, past surgical history and problem list.   Objective:   Vitals:   06/03/22 1413  BP: 115/71  Pulse: 71  Weight: 156 lb 1.6 oz (70.8 kg)    Fetal Status: Fetal Heart Rate (bpm): 148 Fundal Height: 31 cm Movement: Present     General:  Alert, oriented and cooperative. Patient is in no acute distress.  Skin: Skin is warm and dry. No rash noted.   Cardiovascular: Normal heart rate noted  Respiratory: Normal respiratory effort, no problems with respiration noted  Abdomen: Soft, gravid, appropriate for gestational age.  Pain/Pressure: Absent     Pelvic: Cervical exam deferred        Extremities: Normal range of motion.  Edema: Trace  Mental Status: Normal mood and affect. Normal behavior. Normal judgment and thought content.   Assessment and Plan:  Pregnancy: G2P0010 at [redacted]w[redacted]d 1. Supervision of normal first pregnancy in second trimester - Doing well, FHR WNL, + fetal movement  2. [redacted] weeks gestation of pregnancy - Plan for routine follow-up - Desires Linden Dolin, information provided for class  3. GBS bacteriuria - Treatment in labor  Preterm labor symptoms and general obstetric precautions including but not limited to vaginal bleeding, contractions, leaking of fluid and fetal movement  were reviewed in detail with the patient. Please refer to After Visit Summary for other counseling recommendations.   Return for Patient needs appointment with Midwife around 36 weeks, LOB, IN-PERSON.  Future Appointments  Date Time Provider Department Center  06/17/2022  2:30 PM Corlis Hove, NP [facility] None  06/24/2022  1:50 PM Anyanwu, Jethro Bastos, MD [facility] None  07/07/2022  1:50 PM Gerrit Heck, CNM [facility] None  07/15/2022  2:30 PM Hermina Staggers, MD [facility] None  07/22/2022  2:30 PM Hermina Staggers, MD [facility] None  07/27/2022  2:30 PM Lanae Crumbly, Lahoma Crocker, DO [facility] None  08/03/2022  2:30 PM Constant, Gigi Gin, MD [facility] None    Corlis Hove, NP

## 2022-06-04 LAB — CERVICOVAGINAL ANCILLARY ONLY
Bacterial Vaginitis (gardnerella): NEGATIVE
Candida Glabrata: NEGATIVE
Candida Vaginitis: NEGATIVE
Chlamydia: NEGATIVE
Comment: NEGATIVE
Comment: NEGATIVE
Comment: NEGATIVE
Comment: NEGATIVE
Comment: NEGATIVE
Comment: NORMAL
Neisseria Gonorrhea: NEGATIVE
Trichomonas: NEGATIVE

## 2022-06-17 ENCOUNTER — Ambulatory Visit (INDEPENDENT_AMBULATORY_CARE_PROVIDER_SITE_OTHER): Payer: Medicaid Other | Admitting: Student

## 2022-06-17 ENCOUNTER — Encounter: Payer: Self-pay | Admitting: Student

## 2022-06-17 VITALS — BP 114/70 | HR 79 | Wt 160.8 lb

## 2022-06-17 DIAGNOSIS — Z3402 Encounter for supervision of normal first pregnancy, second trimester: Secondary | ICD-10-CM

## 2022-06-17 DIAGNOSIS — R8271 Bacteriuria: Secondary | ICD-10-CM

## 2022-06-17 DIAGNOSIS — Z3A33 33 weeks gestation of pregnancy: Secondary | ICD-10-CM

## 2022-06-17 LAB — OB RESULTS CONSOLE GBS: GBS: POSITIVE

## 2022-06-17 NOTE — Progress Notes (Signed)
Patient [redacted]w[redacted]d presents for routine prenatal visit. Pt has no questions or concerns at this time.

## 2022-06-17 NOTE — Progress Notes (Signed)
   PRENATAL VISIT NOTE  Subjective:  Kristine Franco is a 21 y.o. G2P0010 at [redacted]w[redacted]d being seen today for ongoing prenatal care.  She is currently monitored for the following issues for this low-risk pregnancy and has Cannabis use disorder, mild, abuse; Anxiety; History of suicide attempt; Supervision of normal first pregnancy in second trimester; and GBS bacteriuria on their problem list.  Patient reports no complaints.  Contractions: Not present. Vag. Bleeding: None.  Movement: Present. Denies leaking of fluid.   The following portions of the patient's history were reviewed and updated as appropriate: allergies, current medications, past family history, past medical history, past social history, past surgical history and problem list.   Objective:   Vitals:   06/17/22 1423  BP: 114/70  Pulse: 79  Weight: 160 lb 12.8 oz (72.9 kg)    Fetal Status: Fetal Heart Rate (bpm): 164 Fundal Height: 33 cm Movement: Present     General:  Alert, oriented and cooperative. Patient is in no acute distress.  Skin: Skin is warm and dry. No rash noted.   Cardiovascular: Normal heart rate noted  Respiratory: Normal respiratory effort, no problems with respiration noted  Abdomen: Soft, gravid, appropriate for gestational age.  Pain/Pressure: Absent     Pelvic: Cervical exam deferred        Extremities: Normal range of motion.  Edema: Trace  Mental Status: Normal mood and affect. Normal behavior. Normal judgment and thought content.   Assessment and Plan:  Pregnancy: G2P0010 at [redacted]w[redacted]d 1. Supervision of normal first pregnancy in second trimester - Frequent and vigorous fetal movement, FHT:164  2. [redacted] weeks gestation of pregnancy - Plan for routine follow-up  3. GBS bacteriuria - treatment in labor  Preterm labor symptoms and general obstetric precautions including but not limited to vaginal bleeding, contractions, leaking of fluid and fetal movement were reviewed in detail with the patient. Please  refer to After Visit Summary for other counseling recommendations.   Return in about 2 weeks (around 07/01/2022) for IN-PERSON, LOB.  Future Appointments  Date Time Provider Department Center  06/24/2022  1:50 PM Anyanwu, Jethro Bastos, MD [facility] None  07/07/2022  1:50 PM Gerrit Heck, CNM [facility] None  07/15/2022  2:30 PM Hermina Staggers, MD [facility] None  07/22/2022  2:30 PM Hermina Staggers, MD [facility] None  07/27/2022  2:30 PM Lanae Crumbly, Lahoma Crocker, DO [facility] None  08/03/2022  2:30 PM Constant, Gigi Gin, MD [facility] None    Corlis Hove, NP

## 2022-06-24 ENCOUNTER — Encounter: Payer: Medicaid Other | Admitting: Obstetrics & Gynecology

## 2022-06-25 ENCOUNTER — Encounter: Payer: Self-pay | Admitting: Obstetrics and Gynecology

## 2022-06-25 ENCOUNTER — Ambulatory Visit (INDEPENDENT_AMBULATORY_CARE_PROVIDER_SITE_OTHER): Payer: Medicaid Other | Admitting: Obstetrics and Gynecology

## 2022-06-25 VITALS — BP 106/65 | HR 72 | Wt 164.6 lb

## 2022-06-25 DIAGNOSIS — Z3A34 34 weeks gestation of pregnancy: Secondary | ICD-10-CM

## 2022-06-25 DIAGNOSIS — Z23 Encounter for immunization: Secondary | ICD-10-CM | POA: Diagnosis not present

## 2022-06-25 DIAGNOSIS — Z3483 Encounter for supervision of other normal pregnancy, third trimester: Secondary | ICD-10-CM

## 2022-06-25 DIAGNOSIS — R8271 Bacteriuria: Secondary | ICD-10-CM

## 2022-06-25 NOTE — Progress Notes (Signed)
Flu vaccine given R Del without difficulty.

## 2022-06-25 NOTE — Progress Notes (Signed)
   LOW-RISK PREGNANCY OFFICE VISIT Patient name: Kristine Franco MRN [id number]  Date of birth: [birth year] Chief Complaint:   Routine Prenatal Visit (/)  History of Present Illness:   Kristine Franco is a 21 y.o. G31P0010 female at [redacted]w[redacted]d with an Estimated Date of Delivery: 08/03/22 being seen today for ongoing management of a low-risk pregnancy.  Today she reports {sx:14538}. Contractions: Not present. Vag. Bleeding: None.  Movement: Present. {Actions; denies-reports:120008} leaking of fluid. Review of Systems:   Pertinent items are noted in HPI Denies abnormal vaginal discharge w/ itching/odor/irritation, headaches, visual changes, shortness of breath, chest pain, abdominal pain, severe nausea/vomiting, or problems with urination or bowel movements unless otherwise stated above. Pertinent History Reviewed:  Reviewed past medical,surgical, social, obstetrical and family history.  Reviewed problem list, medications and allergies. Physical Assessment:   Vitals:   06/25/22 1443  BP: 106/65  Pulse: 72  Weight: 164 lb 9.6 oz (74.7 kg)  Body mass index is 29.16 kg/m.        Physical Examination:   General appearance: Well appearing, and in no distress  Mental status: Alert, oriented to person, place, and time  Skin: Warm & dry  Cardiovascular: Normal heart rate noted  Respiratory: Normal respiratory effort, no distress  Abdomen: Soft, gravid, nontender  Pelvic: {Blank single:19197::"Cervical exam performed","Cervical exam deferred"}         Extremities: Edema: Trace  Fetal Status: Fetal Heart Rate (bpm): 137 Fundal Height: 35 cm Movement: Present    No results found for this or any previous visit (from the past 24 hour(s)).  Assessment & Plan:  1) Low-risk pregnancy G2P0010 at [redacted]w[redacted]d with an Estimated Date of Delivery: 08/03/22   2) ***, ***   Meds: No orders of the defined types were placed in this encounter.  Labs/procedures today: ***  Plan:  Continue routine obstetrical  care ***  Reviewed: {Blank single:19197::"Term","Preterm"} labor symptoms and general obstetric precautions including but not limited to vaginal bleeding, contractions, leaking of fluid and fetal movement were reviewed in detail with the patient.  All questions were answered. *** home bp cuff. Rx faxed to ***. Check bp weekly, let us know if >140/90.   Follow-up: Return in about 2 weeks (around 07/09/2022) for Return OB w/GBS.  Orders Placed This Encounter  Procedures   Flu Vaccine QUAD 36+ mos IM (Fluarix, Quad PF)   Raelyn Mora MSN, CNM 06/25/2022 2:53 PM

## 2022-06-25 NOTE — Patient Instructions (Signed)

## 2022-07-07 ENCOUNTER — Telehealth (INDEPENDENT_AMBULATORY_CARE_PROVIDER_SITE_OTHER): Payer: Medicaid Other

## 2022-07-07 VITALS — BP 127/82 | HR 76 | Wt 167.0 lb

## 2022-07-07 DIAGNOSIS — F419 Anxiety disorder, unspecified: Secondary | ICD-10-CM

## 2022-07-07 DIAGNOSIS — Z3403 Encounter for supervision of normal first pregnancy, third trimester: Secondary | ICD-10-CM

## 2022-07-07 DIAGNOSIS — O99343 Other mental disorders complicating pregnancy, third trimester: Secondary | ICD-10-CM

## 2022-07-07 DIAGNOSIS — F1210 Cannabis abuse, uncomplicated: Secondary | ICD-10-CM

## 2022-07-07 DIAGNOSIS — O99323 Drug use complicating pregnancy, third trimester: Secondary | ICD-10-CM

## 2022-07-07 DIAGNOSIS — Z3A36 36 weeks gestation of pregnancy: Secondary | ICD-10-CM

## 2022-07-07 NOTE — Patient Instructions (Addendum)
  Contraindications to Waterbirth at [facility]:   History of c-section  Preterm birth less than 37 weeks  Thick, particulate meconium-stained fluid  Maternal fever over 101 degrees Fahrenheit  Heavy bleeding or signs of placental abruption  Pre-eclampsia, Chronic hypertension or Gestational Hypertension  Any abnormal fetal heart rate pattern  If epidural analgesia is utilized during labor  Malpresentation  Multiple gestation pregnancy  Active communicable disease   Significant limitation to mobility  Preexisting Diabetes, A2 Gestational diabetes or Uncontrolled A1 Gestational diabetes  Any other indication based on medical provider discretion  Special Considerations: Some maternal conditions that may become contraindications by provider discretion are history of seizure or syncope, especially without documentation of management or resolution.  The patient will be required to leave the birthing tub if there is a situation warranting a more complete  fetal assessment, continuous fetal monitoring and/or the necessity for the infant to be delivered outside  the tub.   

## 2022-07-07 NOTE — Progress Notes (Signed)
I connected with Kristine Franco 07/07/22 at  1:50 PM EDT by: MyChart video and verified that I am speaking with the correct person using two identifiers.  Patient is located at home and provider is located at [facility].     I discussed the limitations, risks, security and privacy concerns of performing an evaluation and management service by MyChart video and the availability of in person appointments. I also discussed with the patient that there may be a patient responsible charge related to this service. By engaging in this virtual visit, you consent to the provision of healthcare.  Additionally, you authorize for your insurance to be billed for the services provided during this visit.  The patient expressed understanding and agreed to proceed.  The following staff members participated in the virtual visit:  J.Brannen Koppen, CNM    PRENATAL VISIT NOTE  Subjective:  Kristine Franco is a 21 y.o. G2P0010 at [redacted]w[redacted]d  for virtual visit for ongoing prenatal care.  She is currently monitored for the following issues for this low-risk pregnancy and has Cannabis use disorder, mild, abuse; Anxiety; History of suicide attempt; Supervision of normal first pregnancy in second trimester; and GBS bacteriuria on their problem list.  Patient reports no complaints. She endorses fetal movement and denies cramping or contractions. No issues with urination, constipation, or diarrhea.  Contractions: Not present. Vag. Bleeding: None.  Movement: Present. Denies leaking of fluid.   The following portions of the patient's history were reviewed and updated as appropriate: allergies, current medications, past family history, past medical history, past social history, past surgical history and problem list.   Objective:   Vitals:   07/07/22 1330  BP: 127/82  Pulse: 76  Weight: 167 lb (75.8 kg)   Self-Obtained  Fetal Status:     Movement: Present     Assessment and Plan:  Pregnancy: G2P0010 at [redacted]w[redacted]d 1. Supervision of  normal first pregnancy in second trimester -Anticipatory guidance for upcoming appts. -Patient to schedule next appt in 1 weeks for an in-person visit.   2. [redacted] weeks gestation of pregnancy - Pt is interested in waterbirth.  No contraindications at this time per chart review/patient assessment.   - Pt enrolled in class and scheduled for this Thursday.  - Discussed waterbirth as option for low-risk pregnancy.  Reviewed conditions that may arise during pregnancy that will risk pt out of waterbirth including hypertension, fetal growth restriction <10%ile, MSAF. Further discussed unforseen factors that could contribute to not having WB including provider preference and availability as well as tub availability. -Contraindication list provided in AVS for patient review.    Term labor symptoms and general obstetric precautions including but not limited to vaginal bleeding, contractions, leaking of fluid and fetal movement were reviewed in detail with the patient.  No follow-ups on file.  Future Appointments  Date Time Provider [place]  07/15/2022  2:30 PM Chancy Milroy, MD [place] None  07/22/2022  2:30 PM Chancy Milroy, MD [place] None  07/27/2022  2:30 PM Clement Husbands, Ernestine Conrad, DO [place] None  08/03/2022  2:30 PM Constant, Vickii Chafe, MD [place] None     Time spent on virtual visit: 5 minutes  Maryann Conners, CNM

## 2022-07-07 NOTE — Progress Notes (Signed)
I connected with  Kristine Franco on 07/07/22 by a video enabled telemedicine application and verified that I am speaking with the correct person using two identifiers.   I discussed the limitations of evaluation and management by telemedicine. The patient expressed understanding and agreed to proceed.   MyChart ROB, reports no concerns today.

## 2022-07-08 ENCOUNTER — Encounter: Payer: Medicaid Other | Admitting: Obstetrics and Gynecology

## 2022-07-15 ENCOUNTER — Ambulatory Visit (INDEPENDENT_AMBULATORY_CARE_PROVIDER_SITE_OTHER): Payer: Medicaid Other | Admitting: Obstetrics and Gynecology

## 2022-07-15 ENCOUNTER — Encounter: Payer: Self-pay | Admitting: Obstetrics and Gynecology

## 2022-07-15 VITALS — BP 120/75 | HR 66 | Wt 169.4 lb

## 2022-07-15 DIAGNOSIS — Z3483 Encounter for supervision of other normal pregnancy, third trimester: Secondary | ICD-10-CM

## 2022-07-15 DIAGNOSIS — Z3402 Encounter for supervision of normal first pregnancy, second trimester: Secondary | ICD-10-CM

## 2022-07-15 DIAGNOSIS — R8271 Bacteriuria: Secondary | ICD-10-CM

## 2022-07-15 DIAGNOSIS — Z3A37 37 weeks gestation of pregnancy: Secondary | ICD-10-CM

## 2022-07-15 NOTE — Progress Notes (Signed)
Subjective:  Kristine Franco is a 21 y.o. G2P0010 at [redacted]w[redacted]d being seen today for ongoing prenatal care.  She is currently monitored for the following issues for this low-risk pregnancy and has Cannabis use disorder, mild, abuse; Anxiety; History of suicide attempt; Supervision of normal first pregnancy in second trimester; and GBS bacteriuria on their problem list.  Patient reports general discomforts of pregnancy.  Contractions: Not present. Vag. Bleeding: None.  Movement: Present. Denies leaking of fluid.   The following portions of the patient's history were reviewed and updated as appropriate: allergies, current medications, past family history, past medical history, past social history, past surgical history and problem list. Problem list updated.  Objective:   Vitals:   07/15/22 1434  BP: 120/75  Pulse: 66  Weight: 169 lb 6.4 oz (76.8 kg)    Fetal Status: Fetal Heart Rate (bpm): 140   Movement: Present     General:  Alert, oriented and cooperative. Patient is in no acute distress.  Skin: Skin is warm and dry. No rash noted.   Cardiovascular: Normal heart rate noted  Respiratory: Normal respiratory effort, no problems with respiration noted  Abdomen: Soft, gravid, appropriate for gestational age. Pain/Pressure: Present     Pelvic:  Cervical exam deferred        Extremities: Normal range of motion.  Edema: Trace  Mental Status: Normal mood and affect. Normal behavior. Normal judgment and thought content.   Urinalysis:      Assessment and Plan:  Pregnancy: G2P0010 at [redacted]w[redacted]d  1. Supervision of normal first pregnancy in second trimester Urine for GC/C  Labor precautions  2. GBS bacteriuria Tx while in labor  Term labor symptoms and general obstetric precautions including but not limited to vaginal bleeding, contractions, leaking of fluid and fetal movement were reviewed in detail with the patient. Please refer to After Visit Summary for other counseling recommendations.   Return in about 1 week (around 07/22/2022) for OB visit, with nurse midwife for waterbirth discussion.   Chancy Milroy, MD

## 2022-07-15 NOTE — Patient Instructions (Signed)
Vaginal Delivery  Vaginal delivery means that you give birth by pushing your baby out of your birth canal (vagina). Your health care team will help you before, during, and after vaginal delivery. Birth experiences are unique for every woman and every pregnancy, and birth experiences vary depending on where you choose to give birth. What are the risks and benefits? Generally, this is safe. However, problems may occur, including: Bleeding. Infection. Damage to other structures such as vaginal tearing. Allergic reactions to medicines. Despite the risks, benefits of vaginal delivery include less risk of bleeding and infection and a shorter recovery time compared to a Cesarean delivery. Cesarean delivery, or C-section, is the surgical delivery of a baby. What happens when I arrive at the birth center or hospital? Once you are in labor and have been admitted into the hospital or birth center, your health care team may: Review your pregnancy history and any concerns that you have. Talk with you about your birth plan and discuss pain control options. Check your blood pressure, breathing, and heartbeat. Assess your baby's heartbeat. Monitor your uterus for contractions. Check whether your bag of water (amniotic sac) has broken (ruptured). Insert an IV into one of your veins. This may be used to give you fluids and medicines. Monitoring Your health care team may assess your contractions (uterine monitoring) and your baby's heart rate (fetal monitoring). You may need to be monitored: Often, but not continuously (intermittently). All the time or for long periods at a time (continuously). Continuous monitoring may be needed if: You are taking certain medicines, such as medicine to relieve pain or make your contractions stronger. You have pregnancy or labor complications. Monitoring may be done by: Placing a special stethoscope or a handheld monitoring device on your abdomen to check your baby's  heartbeat and to check for contractions. Placing monitors on your abdomen (external monitors) to record your baby's heartbeat and the frequency and length of contractions. Placing monitors inside your uterus through your vagina (internal monitors) to record your baby's heartbeat and the frequency, length, and strength of your contractions. Depending on the type of monitor, it may remain in your uterus or on your baby's head until birth. Telemetry. This is a type of continuous monitoring that can be done with external or internal monitors. Instead of having to stay in bed, you are able to move around. Physical exam Your health care team may perform frequent physical exams. This may include: Checking how and where your baby is positioned in your uterus. Checking your cervix to determine: Whether it is thinning out (effacing). Whether it is opening up (dilating). What happens during labor and delivery?  Normal labor and delivery is divided into the following three stages: Stage 1 This is the longest stage of labor. Throughout this stage, you will feel contractions. Contractions generally feel mild, infrequent, and irregular at first. They get stronger, more frequent, and more regular as you move through this stage. You may have contractions about every 2-3 minutes. This stage ends when your cervix is completely dilated to 4 inches (10 cm) and completely effaced. Stage 2 This stage starts once your cervix is completely effaced and dilated and lasts until the delivery of your baby. This is the stage where you will feel an urge to push your baby out of your vagina. You may feel stretching and burning pain, especially when the widest part of your baby's head passes through the vaginal opening (crowning). Once your baby is delivered, the umbilical cord will be   clamped and cut. Timing of cutting the cord will depend on your wishes, your baby's health, and your health care provider's practices. Your baby  will be placed on your bare chest (skin-to-skin contact) in an upright position and covered with a warm blanket. If you are choosing to breastfeed, watch your baby for feeding cues, like rooting or sucking, and help the baby to your breast for his or her first feeding. Stage 3 This stage starts immediately after the birth of your baby and ends after you deliver the placenta. This stage may take anywhere from 5 to 30 minutes. After your baby has been delivered, you will feel contractions as your body expels the placenta. These contractions also help your uterus get smaller and reduce bleeding. What can I expect after labor and delivery? After labor is over, you and your baby will be assessed closely until you are ready to go home. Your health care team will teach you how to care for yourself and your baby. You and your baby may be encouraged to stay in the same room (rooming in) during your hospital stay. This will help promote early bonding and successful breastfeeding. Your uterus will be checked and massaged regularly (fundal massage). You may continue to receive fluids and medicines through an IV. You will have some soreness and pain in your abdomen, vagina, and the area of skin between your vaginal opening and your anus (perineum). If an incision was made near your vagina (episiotomy) or if you had some vaginal tearing during delivery, cold compresses may be placed on your episiotomy or your tear. This helps to reduce pain and swelling. It is normal to have vaginal bleeding after delivery. Wear a sanitary pad for vaginal bleeding and discharge. Summary Vaginal delivery means that you will give birth by pushing your baby out of your birth canal (vagina). Your health care team will monitor you and your baby throughout the stages of labor. After you deliver your baby, your health care team will continue to assess you and your baby to ensure you are both recovering as expected after delivery. This  information is not intended to replace advice given to you by your health care provider. Make sure you discuss any questions you have with your health care provider. Document Revised: 09/02/2020 Document Reviewed: 09/02/2020 Elsevier Patient Education  2023 [employer]  

## 2022-07-22 ENCOUNTER — Ambulatory Visit (INDEPENDENT_AMBULATORY_CARE_PROVIDER_SITE_OTHER): Payer: Medicaid Other | Admitting: Obstetrics and Gynecology

## 2022-07-22 ENCOUNTER — Encounter: Payer: Self-pay | Admitting: Obstetrics and Gynecology

## 2022-07-22 VITALS — BP 108/69 | HR 70 | Wt 169.8 lb

## 2022-07-22 DIAGNOSIS — Z3402 Encounter for supervision of normal first pregnancy, second trimester: Secondary | ICD-10-CM

## 2022-07-22 DIAGNOSIS — Z3A38 38 weeks gestation of pregnancy: Secondary | ICD-10-CM

## 2022-07-22 DIAGNOSIS — R8271 Bacteriuria: Secondary | ICD-10-CM

## 2022-07-22 NOTE — Progress Notes (Signed)
Subjective:  Kristine Franco is a 21 y.o. G2P0010 at [redacted]w[redacted]d being seen today for ongoing prenatal care.  She is currently monitored for the following issues for this low-risk pregnancy and has Cannabis use disorder, mild, abuse; Anxiety; History of suicide attempt; Supervision of normal first pregnancy in second trimester; and GBS bacteriuria on their problem list.  Patient reports general discomforts of pregnancy.  Contractions: Not present. Vag. Bleeding: None.  Movement: Present. Denies leaking of fluid.   The following portions of the patient's history were reviewed and updated as appropriate: allergies, current medications, past family history, past medical history, past social history, past surgical history and problem list. Problem list updated.  Objective:   Vitals:   07/22/22 1437  BP: 108/69  Pulse: 70  Weight: 169 lb 12.8 oz (77 kg)    Fetal Status:     Movement: Present     General:  Alert, oriented and cooperative. Patient is in no acute distress.  Skin: Skin is warm and dry. No rash noted.   Cardiovascular: Normal heart rate noted  Respiratory: Normal respiratory effort, no problems with respiration noted  Abdomen: Soft, gravid, appropriate for gestational age. Pain/Pressure: Present     Pelvic:  Cervical exam deferred        Extremities: Normal range of motion.  Edema: Trace  Mental Status: Normal mood and affect. Normal behavior. Normal judgment and thought content.   Urinalysis:      Assessment and Plan:  Pregnancy: G2P0010 at [redacted]w[redacted]d  1. Supervision of normal first pregnancy in second trimester Labor precautions  2. GBS bacteriuria Tx while in labor  Term labor symptoms and general obstetric precautions including but not limited to vaginal bleeding, contractions, leaking of fluid and fetal movement were reviewed in detail with the patient. Please refer to After Visit Summary for other counseling recommendations.  Return in about 1 week (around 07/29/2022) for  OB visit, pt  MUST see [facility]W with all visit forward, Pt deires waterbirth.   Chancy Milroy, MD

## 2022-07-22 NOTE — Patient Instructions (Addendum)
Considering Waterbirth? Guide for patients at [facility]) Why consider waterbirth? Gentle birth for babies  Less pain medicine used in labor  May allow for passive descent/less pushing  May reduce perineal tears  More mobility and instinctive maternal position changes  Increased maternal relaxation   Is waterbirth safe? What are the risks of infection, drowning or other complications? Infection:  Very low risk (3.7 % for tub vs 4.8% for bed)  7 in 8000 waterbirths with documented infection  Poorly cleaned equipment most common cause  Slightly lower group B strep transmission rate  Drowning  Maternal:  Very low risk  Related to seizures or fainting  Newborn:  Very low risk. No evidence of increased risk of respiratory problems in multiple large studies  Physiological protection from breathing under water  Avoid underwater birth if there are any fetal complications  Once baby's head is out of the water, keep it out.  Birth complication  Some reports of cord trauma, but risk decreased by bringing baby to surface gradually  No evidence of increased risk of shoulder dystocia. Mothers can usually change positions faster in water than in a bed, possibly aiding the maneuvers to free the shoulder.   There are 2 things you MUST do to have a waterbirth with [facility]: Attend a waterbirth class at [place] at [facility]   3rd Wednesday of every month from 7-9 pm (virtual during [place]) [employer] at [email or web address] or [email or web address] or by calling [phone] Bring [place] the certificate from the class to your prenatal appointment or send via [place] with a midwife at 36 weeks* to see if you can still plan a waterbirth and to sign the consent.   *We also recommend that you schedule as many of your prenatal visits with a midwife as possible.    Helpful information: You may want to bring a bathing suit top to the hospital  to wear during labor but this is optional.  All other supplies are provided by the hospital. Please arrive at the hospital with signs of active labor, and do not wait at home until late in labor. It takes 45 min- 1 hour for fetal monitoring, and check in to your room to take place, plus transport and filling of the waterbirth tub.    Things that would prevent you from having a waterbirth: Premature, <37wks  Previous cesarean birth  Presence of thick meconium-stained fluid  Multiple gestation (Twins, triplets, etc.)  Uncontrolled diabetes or gestational diabetes requiring medication  Hypertension diagnosed in pregnancy or preexisting hypertension (gestational hypertension, preeclampsia, or chronic hypertension) Fetal growth restriction (your baby measures less than 10th percentile on ultrasound) Heavy vaginal bleeding  Non-reassuring fetal heart rate  Active infection (MRSA, etc.). Group B Strep is NOT a contraindication for waterbirth.  If your labor has to be induced and induction method requires continuous monitoring of the baby's heart rate  Other risks/issues identified by your obstetrical provider   Please remember that birth is unpredictable. Under certain unforeseeable circumstances your provider may advise against giving birth in the tub. These decisions will be made on a case-by-case basis and with the safety of you and your baby as our highest priority.    Updated 01/21/22 Vaginal Delivery  Vaginal delivery means that you give birth by pushing your baby out of your birth canal (vagina). Your health care team will help you before, during, and after vaginal delivery. Birth experiences are unique for every woman and every pregnancy,  and birth experiences vary depending on where you choose to give birth. What are the risks and benefits? Generally, this is safe. However, problems may occur, including: Bleeding. Infection. Damage to other structures such as vaginal tearing. Allergic  reactions to medicines. Despite the risks, benefits of vaginal delivery include less risk of bleeding and infection and a shorter recovery time compared to a Cesarean delivery. Cesarean delivery, or C-section, is the surgical delivery of a baby. What happens when I arrive at the birth center or hospital? Once you are in labor and have been admitted into the hospital or birth center, your health care team may: Review your pregnancy history and any concerns that you have. Talk with you about your birth plan and discuss pain control options. Check your blood pressure, breathing, and heartbeat. Assess your baby's heartbeat. Monitor your uterus for contractions. Check whether your bag of water (amniotic sac) has broken (ruptured). Insert an IV into one of your veins. This may be used to give you fluids and medicines. Monitoring Your health care team may assess your contractions (uterine monitoring) and your baby's heart rate (fetal monitoring). You may need to be monitored: Often, but not continuously (intermittently). All the time or for long periods at a time (continuously). Continuous monitoring may be needed if: You are taking certain medicines, such as medicine to relieve pain or make your contractions stronger. You have pregnancy or labor complications. Monitoring may be done by: Placing a special stethoscope or a handheld monitoring device on your abdomen to check your baby's heartbeat and to check for contractions. Placing monitors on your abdomen (external monitors) to record your baby's heartbeat and the frequency and length of contractions. Placing monitors inside your uterus through your vagina (internal monitors) to record your baby's heartbeat and the frequency, length, and strength of your contractions. Depending on the type of monitor, it may remain in your uterus or on your baby's head until birth. Telemetry. This is a type of continuous monitoring that can be done with external or  internal monitors. Instead of having to stay in bed, you are able to move around. Physical exam Your health care team may perform frequent physical exams. This may include: Checking how and where your baby is positioned in your uterus. Checking your cervix to determine: Whether it is thinning out (effacing). Whether it is opening up (dilating). What happens during labor and delivery?  Normal labor and delivery is divided into the following three stages: Stage 1 This is the longest stage of labor. Throughout this stage, you will feel contractions. Contractions generally feel mild, infrequent, and irregular at first. They get stronger, more frequent, and more regular as you move through this stage. You may have contractions about every 2-3 minutes. This stage ends when your cervix is completely dilated to 4 inches (10 cm) and completely effaced. Stage 2 This stage starts once your cervix is completely effaced and dilated and lasts until the delivery of your baby. This is the stage where you will feel an urge to push your baby out of your vagina. You may feel stretching and burning pain, especially when the widest part of your baby's head passes through the vaginal opening (crowning). Once your baby is delivered, the umbilical cord will be clamped and cut. Timing of cutting the cord will depend on your wishes, your baby's health, and your health care provider's practices. Your baby will be placed on your bare chest (skin-to-skin contact) in an upright position and covered with a warm  blanket. If you are choosing to breastfeed, watch your baby for feeding cues, like rooting or sucking, and help the baby to your breast for his or her first feeding. Stage 3 This stage starts immediately after the birth of your baby and ends after you deliver the placenta. This stage may take anywhere from 5 to 30 minutes. After your baby has been delivered, you will feel contractions as your body expels the  placenta. These contractions also help your uterus get smaller and reduce bleeding. What can I expect after labor and delivery? After labor is over, you and your baby will be assessed closely until you are ready to go home. Your health care team will teach you how to care for yourself and your baby. You and your baby may be encouraged to stay in the same room (rooming in) during your hospital stay. This will help promote early bonding and successful breastfeeding. Your uterus will be checked and massaged regularly (fundal massage). You may continue to receive fluids and medicines through an IV. You will have some soreness and pain in your abdomen, vagina, and the area of skin between your vaginal opening and your anus (perineum). If an incision was made near your vagina (episiotomy) or if you had some vaginal tearing during delivery, cold compresses may be placed on your episiotomy or your tear. This helps to reduce pain and swelling. It is normal to have vaginal bleeding after delivery. Wear a sanitary pad for vaginal bleeding and discharge. Summary Vaginal delivery means that you will give birth by pushing your baby out of your birth canal (vagina). Your health care team will monitor you and your baby throughout the stages of labor. After you deliver your baby, your health care team will continue to assess you and your baby to ensure you are both recovering as expected after delivery. This information is not intended to replace advice given to you by your health care provider. Make sure you discuss any questions you have with your health care provider. Document Revised: 09/02/2020 Document Reviewed: 09/02/2020 Elsevier Patient Education  2023 [employer].

## 2022-07-24 ENCOUNTER — Inpatient Hospital Stay (HOSPITAL_COMMUNITY)
Admission: AD | Admit: 2022-07-24 | Discharge: 2022-07-24 | Disposition: A | Discharge status: Home / Self Care | Payer: Medicaid Other | Attending: Obstetrics and Gynecology | Admitting: Obstetrics and Gynecology

## 2022-07-24 ENCOUNTER — Other Ambulatory Visit: Payer: Self-pay

## 2022-07-24 ENCOUNTER — Encounter (HOSPITAL_COMMUNITY): Payer: Self-pay | Admitting: Obstetrics and Gynecology

## 2022-07-24 DIAGNOSIS — Z3689 Encounter for other specified antenatal screening: Secondary | ICD-10-CM

## 2022-07-24 DIAGNOSIS — Z3A38 38 weeks gestation of pregnancy: Secondary | ICD-10-CM | POA: Insufficient documentation

## 2022-07-24 DIAGNOSIS — O23593 Infection of other part of genital tract in pregnancy, third trimester: Secondary | ICD-10-CM | POA: Insufficient documentation

## 2022-07-24 DIAGNOSIS — O98813 Other maternal infectious and parasitic diseases complicating pregnancy, third trimester: Secondary | ICD-10-CM | POA: Insufficient documentation

## 2022-07-24 DIAGNOSIS — B3731 Acute candidiasis of vulva and vagina: Secondary | ICD-10-CM | POA: Diagnosis not present

## 2022-07-24 DIAGNOSIS — Z0371 Encounter for suspected problem with amniotic cavity and membrane ruled out: Secondary | ICD-10-CM

## 2022-07-24 LAB — AMNISURE RUPTURE OF MEMBRANE (ROM) NOT AT ARMC: Amnisure ROM: NEGATIVE

## 2022-07-24 MED ORDER — ONDANSETRON 4 MG PO TBDP
8.0000 mg | ORAL_TABLET | Freq: Once | ORAL | Status: AC
  Administered 2022-07-24: 8 mg via ORAL
  Filled 2022-07-24: qty 2

## 2022-07-24 MED ORDER — TERCONAZOLE 0.8 % VA CREA: 1.0000 | TOPICAL_CREAM | Freq: Every day | VAGINAL | 0 refills | Status: DC

## 2022-07-24 NOTE — MAU Provider Note (Signed)
Event Date/Time   First Provider Initiated Contact with Patient 07/24/22 2018      S: Ms. Kristine Franco is a 21 y.o. G2P0010 at [redacted]w[redacted]d  who presents to MAU today, via EMS, complaining of leaking of fluid since midnight. She denies vaginal bleeding. She  was unsure of if she was having  contractions. However, she reports the baby "is balling up." She reports normal fetal movement.  She endorses sexual activity today.   O: BP 123/81 (BP Location: Right Arm)   Pulse 69   Temp 98.1 F (36.7 C) (Oral)   Resp 18   Ht 5\' 3"  (1.6 m)   Wt 77 kg   LMP 10/27/2021   SpO2 100%   BMI 30.08 kg/m  GENERAL: Well-developed, well-nourished female in no acute distress.  HEAD: Normocephalic, atraumatic.  CHEST: Normal effort of breathing, regular heart rate ABDOMEN: Soft, nontender, gravid PELVIC: Normal external female genitalia. Vagina is pink and rugated. Cervix with normal contour, no lesions. Curdy white discharge.  Scant pooling of white fluid. Fern and Amnisure Collected  Cervical exam:      Fetal Monitoring: FHT: 120 bpm, Mod Var, -Decels, +Accels Toco: Q28min  Results for orders placed or performed during the hospital encounter of 07/24/22 (from the past 24 hour(s))  Amnisure rupture of membrane (rom)not at [facility]     Status: None   Collection Time: 07/24/22  8:27 PM  Result Value Ref Range   Amnisure ROM NEGATIVE      A: SIUP at [redacted]w[redacted]d  Membranes intact Candidiasis of Vagina Cat I FT  P: Fern negative. Patient informed of apparent yeast infection. Reviewed treatment with 3day Terazol cream. Amnisure collected and negative. Nurse to check cervix if patient desires. NST reactive Discharge to home with precautions. Follow up in office as scheduled. Return to MAU as appropriate.   Gavin Pound, CNM 07/24/2022 8:18 PM

## 2022-07-24 NOTE — MAU Note (Signed)
..  Kristine Franco is a 21 y.o. at [redacted]w[redacted]d here in MAU reporting: leaking fluid since 0000 this morning. +FM denies vaginal bleeding   Onset of complaint: 07/24/22 Pain score: 3/10 Vitals:   07/24/22 2002  BP: 123/81  Pulse: 69  Resp: 18  Temp: 98.1 F (36.7 C)  SpO2: 100%     FHT:120 Lab orders placed from triage:   UA

## 2022-07-26 ENCOUNTER — Inpatient Hospital Stay (HOSPITAL_COMMUNITY)
Admission: EM | Admit: 2022-07-26 | Discharge: 2022-07-27 | Disposition: A | Discharge status: Home / Self Care | Payer: Medicaid Other | Attending: Obstetrics & Gynecology | Admitting: Obstetrics & Gynecology

## 2022-07-26 ENCOUNTER — Other Ambulatory Visit: Payer: Self-pay

## 2022-07-26 DIAGNOSIS — O2686 Pruritic urticarial papules and plaques of pregnancy (PUPPP): Secondary | ICD-10-CM | POA: Insufficient documentation

## 2022-07-26 DIAGNOSIS — Z3A39 39 weeks gestation of pregnancy: Secondary | ICD-10-CM | POA: Insufficient documentation

## 2022-07-26 DIAGNOSIS — R21 Rash and other nonspecific skin eruption: Secondary | ICD-10-CM | POA: Insufficient documentation

## 2022-07-26 DIAGNOSIS — O99513 Diseases of the respiratory system complicating pregnancy, third trimester: Secondary | ICD-10-CM | POA: Insufficient documentation

## 2022-07-26 NOTE — ED Triage Notes (Signed)
Pt here from home for rash to abdomen after being prescribed zofran for nausea. Pt is pregnant and is due 08/03/2022. Pt has redness to R side of abdomen, states it itches and burns

## 2022-07-26 NOTE — ED Provider Triage Note (Signed)
Emergency Medicine Provider Triage Evaluation Note  Kristine Franco , a 21 y.o. female  was evaluated in triage.  Pt complains of rash x2 days started after Zofran.  To her abdomen, its pruritic.  No fevers or chills, denies abdominal pain or vaginal bleeding.  Patient is due for delivery in a week..  Review of Systems  PER HPI  Physical Exam  BP 134/67   Pulse 68   Temp 98.4 F (36.9 C)   Resp 19   LMP 10/27/2021   SpO2 98%  Gen:   Awake, no distress   Resp:  Normal effort  MSK:   Moves extremities without difficulty  Other:  Rash to abdomen   Medical Decision Making  Medically screening exam initiated at 11:21 PM.  Appropriate orders placed.  Kristine Franco was informed that the remainder of the evaluation will be completed by another provider, this initial triage assessment does not replace that evaluation, and the importance of remaining in the ED until their evaluation is complete.     Sherrill Raring, PA-C 07/26/22 2321

## 2022-07-26 NOTE — MAU Note (Signed)
.  Kristine Franco is a 21 y.o. at [redacted]w[redacted]d here in MAU reporting: here from main ED.. was in here late Friday night was given nausea medication, yesterday woke up with a rash/red bumps all over abdomen and is itchy. Denies VB, LOF, or any OB complaints. +FM.   Onset of complaint: yesterday morning.  Pain score: 0 Vitals:   07/26/22 2258 07/26/22 2340  BP: 134/67 129/89  Pulse: 68 81  Resp: 19 18  Temp: 98.4 F (36.9 C) 97.9 F (36.6 C)  SpO2: 98% 100%     FHT:115 Lab orders placed from triage:  none.

## 2022-07-27 ENCOUNTER — Encounter: Payer: Medicaid Other | Admitting: Family Medicine

## 2022-07-27 ENCOUNTER — Encounter (HOSPITAL_COMMUNITY): Payer: Self-pay | Admitting: Obstetrics & Gynecology

## 2022-07-27 DIAGNOSIS — R21 Rash and other nonspecific skin eruption: Secondary | ICD-10-CM | POA: Diagnosis not present

## 2022-07-27 DIAGNOSIS — O2686 Pruritic urticarial papules and plaques of pregnancy (PUPPP): Secondary | ICD-10-CM

## 2022-07-27 DIAGNOSIS — Z3A39 39 weeks gestation of pregnancy: Secondary | ICD-10-CM

## 2022-07-27 DIAGNOSIS — O99513 Diseases of the respiratory system complicating pregnancy, third trimester: Secondary | ICD-10-CM | POA: Diagnosis not present

## 2022-07-27 MED ORDER — TRIAMCINOLONE ACETONIDE 0.1 % EX CREA
TOPICAL_CREAM | Freq: Once | CUTANEOUS | Status: AC
  Filled 2022-07-27: qty 15

## 2022-07-27 MED ORDER — HYDROXYZINE HCL 25 MG PO TABS: 25.0000 mg | ORAL_TABLET | Freq: Four times a day (QID) | ORAL | 0 refills | Status: DC | PRN

## 2022-07-27 MED ORDER — HYDROXYZINE HCL 25 MG PO TABS
25.0000 mg | ORAL_TABLET | Freq: Once | ORAL | Status: AC
  Administered 2022-07-27: 25 mg via ORAL
  Filled 2022-07-27: qty 1

## 2022-07-27 NOTE — Progress Notes (Addendum)
RN entered pt room, pt sitting up in bed scratching abdomen. Pt states "please take this off, I'm about to rip this off" referring to the EFM. RN informed Robyne Askew, NP that pt is requesting monitors be removed. NP stated it was ok for monitors to be removed.

## 2022-07-27 NOTE — MAU Provider Note (Signed)
History     [id number]  Arrival date and time: 07/26/22 2247    Chief Complaint  Patient presents with   Rash     HPI Kristine Franco is a 21 y.o. at 14w0dwho presents for rash. Reports intense itching of her abdomen since yesterday. Has noticed red lines on her abdomen since scratching. No soap, detergent, or lotion changes. Hasn't treated symptoms. Took zofran Friday night. Denies rash or itching on any other part of her body. No difficulty swallowing or shortness of breath. Denies abdominal pain, vaginal bleeding, or LOF. Reports good fetal movement.   OB History     Gravida  2   Para      Term      Preterm      AB  1   Living         SAB  1   IAB      Ectopic      Multiple      Live Births              Past Medical History:  Diagnosis Date   Acetaminophen overdose, intentional self-harm, initial encounter ([facility] 03/01/2019   Anxiety    Asthma 2019   MDD (major depressive disorder), severe ([facility] 03/04/2019    Past Surgical History:  Procedure Laterality Date   INCISION AND DRAINAGE OF PERITONSILLAR ABCESS N/A 06/23/2017   Procedure: INCISION AND DRAINAGE OF PERITONSILLAR ABCESS;  Surgeon: MHelayne Seminole MD;  Location: [facility] OR;  Service: ENT;  Laterality: N/A;    Family History  Problem Relation Age of Onset   Hypertension Mother    Diabetes Maternal Grandmother    Diabetes Maternal Grandfather     No Known Allergies  No current facility-administered medications on file prior to encounter.   Current Outpatient Medications on File Prior to Encounter  Medication Sig Dispense Refill   Blood Pressure Monitoring (BLOOD PRESSURE KIT) DEVI 1 kit by Does not apply route once a week. 1 each 0   loratadine (CLARITIN) 10 MG tablet Take 1 tablet (10 mg total) by mouth daily. 30 tablet 11   Misc. Devices (GOJJI WEIGHT SCALE) MISC 1 Device by Does not apply route every 30 (thirty) days. 1 each 0   Prenat-Fe Poly-Methfol-FA-DHA (VITAFOL ULTRA)  29-0.6-0.4-200 MG CAPS Take 1 capsule by mouth daily before breakfast. 90 capsule 4   terconazole (TERAZOL 3) 0.8 % vaginal cream Place 1 applicator vaginally at bedtime. 20 g 0     ROS Pertinent positives and negative per HPI, all others reviewed and negative  Physical Exam   BP 124/84   Pulse 62   Temp 97.9 F (36.6 C) (Oral)   Resp 18   Ht _0  (1.6 m)   Wt 77.7 kg   LMP 10/27/2021   SpO2 100%   BMI 30.33 kg/m   Patient Vitals for the past 24 hrs:  BP Temp Temp src Pulse Resp SpO2 Height Weight  07/27/22 0118 124/84 -- -- 62 -- -- -- --  07/26/22 2340 129/89 97.9 F (36.6 C) Oral 81 18 100 % _1  (1.6 m) 77.7 kg  07/26/22 2258 134/67 98.4 F (36.9 C) -- 68 19 98 % -- --    Physical Exam Vitals and nursing note reviewed.  Constitutional:      General: She is not in acute distress.    Appearance: Normal appearance.  HENT:     Head: Normocephalic and atraumatic.  Pulmonary:     Effort: Pulmonary effort  is normal. No respiratory distress.  Abdominal:     Palpations: Abdomen is soft.     Tenderness: There is no abdominal tenderness.  Skin:    Findings: Rash present.     Comments: Erythematous papular rash on abdomen   Neurological:     Mental Status: She is alert.  Psychiatric:        Mood and Affect: Mood normal.        Behavior: Behavior normal.       FHT Baseline 125, moderate variability, 15x15 accels, no decels Toco: Q 5-8 minutes Cat: 1  Labs No results found for this or any previous visit (from the past 24 hour(s)).  Imaging No results found.  MAU Course  Procedures Lab Orders  No laboratory test(s) ordered today   Meds ordered this encounter  Medications   hydrOXYzine (ATARAX) tablet 25 mg   triamcinolone cream (KENALOG) 0.1 % cream   hydrOXYzine (ATARAX) 25 MG tablet    Sig: Take 1 tablet (25 mg total) by mouth every 6 (six) hours as needed for itching.    Dispense:  20 tablet    Refill:  0    Order Specific Question:    Supervising Provider    Answer:   Janyth Pupa [[id number]]   Imaging Orders  No imaging studies ordered today    MDM Rash on abdomen appears consistent with PUPPP rash.  No itching of hands or feet that would have me concerned for cholestasis  Treated with vistaril & kenalog cream in MAU - reports resolution of symptoms.  Assessment and Plan   1. Pruritic urticarial papules and plaques of pregnancy (puppp)   2. [redacted] weeks gestation of pregnancy    -Rx vistaril prn -Sent home with rest of kenalog cream - instructed to apply twice daily as needed -Keep appointment with OB on Wednesday  Jorje Guild, NP 07/27/22 2:12 AM

## 2022-07-29 ENCOUNTER — Ambulatory Visit (INDEPENDENT_AMBULATORY_CARE_PROVIDER_SITE_OTHER): Payer: Medicaid Other | Admitting: Advanced Practice Midwife

## 2022-07-29 VITALS — BP 122/82 | HR 69 | Wt 171.0 lb

## 2022-07-29 DIAGNOSIS — Z3A39 39 weeks gestation of pregnancy: Secondary | ICD-10-CM

## 2022-07-29 DIAGNOSIS — O2686 Pruritic urticarial papules and plaques of pregnancy (PUPPP): Secondary | ICD-10-CM

## 2022-07-29 DIAGNOSIS — Z3403 Encounter for supervision of normal first pregnancy, third trimester: Secondary | ICD-10-CM

## 2022-07-29 NOTE — Patient Instructions (Signed)
Things to Try After 37 weeks to Encourage Labor/Get Ready for Labor:    Try the [employer] at www.milescircuit.com daily to improve baby's position and encourage the onset of labor.  Walk a little and rest a little every day.  Change positions often.  Cervical Ripening: May try one or both Red Raspberry Leaf capsules or tea:  two 300mg or 400mg tablets with each meal, 2-3 times a day, or 1-3 cups of tea daily  Potential Side Effects Of Raspberry Leaf:  Most women do not experience any side effects from drinking raspberry leaf tea. However, nausea and loose stools are possible   Evening Primrose Oil capsules: take 1 capsule by mouth and place one capsule in the vagina every night.    Some of the potential side effects:  Upset stomach  Loose stools or diarrhea  Headaches  Nausea  Sex can also help the cervix ripen and encourage labor onset.    Labor Precautions Reasons to come to MAU at [facility]:  1.  Contractions are  5 minutes apart or less, each last 1 minute, these have been going on for 1-2 hours, and you cannot walk or talk during them 2.  You have a large gush of fluid, or a trickle of fluid that will not stop and you have to wear a pad 3.  You have bleeding that is bright red, heavier than spotting--like menstrual bleeding (spotting can be normal in early labor or after a check of your cervix) 4.  You do not feel the baby moving like he/she normally does  

## 2022-07-29 NOTE — Progress Notes (Signed)
   PRENATAL VISIT NOTE  Subjective:  Kristine Franco is a 21 y.o. G2P0010 at [redacted]w[redacted]d being seen today for ongoing prenatal care.  She is currently monitored for the following issues for this low-risk pregnancy and has Cannabis use disorder, mild, abuse; Anxiety; History of suicide attempt; Supervision of normal first pregnancy in second trimester; and GBS bacteriuria on their problem list.  Patient reports  recent diagnosis of PUPPP, improved with topical steroids .  Contractions: Not present. Vag. Bleeding: None.  Movement: Present. Denies leaking of fluid.   The following portions of the patient's history were reviewed and updated as appropriate: allergies, current medications, past family history, past medical history, past social history, past surgical history and problem list.   Objective:   Vitals:   07/29/22 1456  BP: 122/82  Pulse: 69  Weight: 171 lb (77.6 kg)    Fetal Status: Fetal Heart Rate (bpm): 136   Movement: Present     General:  Alert, oriented and cooperative. Patient is in no acute distress.  Skin: Skin is warm and dry. No rash noted.   Cardiovascular: Normal heart rate noted  Respiratory: Normal respiratory effort, no problems with respiration noted  Abdomen: Soft, gravid, appropriate for gestational age.  Pain/Pressure: Present     Pelvic: Cervical exam deferred        Extremities: Normal range of motion.  Edema: Trace  Mental Status: Normal mood and affect. Normal behavior. Normal judgment and thought content.   Assessment and Plan:  Pregnancy: G2P0010 at [redacted]w[redacted]d 1. [redacted] weeks gestation of pregnancy   2. Encounter for supervision of normal first pregnancy in third trimester --Anticipatory guidance about next visits/weeks of pregnancy given.  - Pt interested in waterbirth and has attended the class.  - Reviewed conditions in labor that will risk her out of water immersion including thick meconium or blood stained amniotic fluid, non-reassuring fetal status on  monitor, excessive bleeding, hypertension, dizziness, use of IV meds, damaged equipment or staffing that does not allow for water immersion, etc.  - The attending midwife must be on the unit for water immersion to begin; pt understands this may delay the start of water immersion. - Reminded pt that signing consent in labor at the hospital also acknowledges they will exit the tub if the attending midwife requests.   3. Pruritic urticarial papules and plaques of pregnancy (puppp) --Pt with improved symptoms --Pt asked if waterbirth was Ok, no contraindications at this time  Term labor symptoms and general obstetric precautions including but not limited to vaginal bleeding, contractions, leaking of fluid and fetal movement were reviewed in detail with the patient. Please refer to After Visit Summary for other counseling recommendations.   Return in about 1 week (around 08/05/2022) for Midwife preferred, for NST.  Future Appointments  Date Time Provider [place]  08/05/2022 10:35 AM Deloris Ping, CNM [facility] None  08/11/2022  6:45 AM [facility] SCHED ROOM [facility] None    Fatima Blank, CNM

## 2022-08-02 ENCOUNTER — Other Ambulatory Visit: Payer: Self-pay | Admitting: Advanced Practice Midwife

## 2022-08-02 DIAGNOSIS — O480 Post-term pregnancy: Secondary | ICD-10-CM

## 2022-08-03 ENCOUNTER — Encounter: Payer: Medicaid Other | Admitting: Obstetrics and Gynecology

## 2022-08-04 ENCOUNTER — Telehealth (HOSPITAL_COMMUNITY): Payer: Self-pay | Admitting: *Deleted

## 2022-08-04 ENCOUNTER — Encounter (HOSPITAL_COMMUNITY): Payer: Self-pay | Admitting: *Deleted

## 2022-08-04 NOTE — Telephone Encounter (Signed)
Preadmission screen  

## 2022-08-05 ENCOUNTER — Inpatient Hospital Stay (HOSPITAL_COMMUNITY)
Admission: AD | Admit: 2022-08-05 | Discharge: 2022-08-08 | DRG: 807 | Disposition: A | Discharge status: Home / Self Care | Payer: Medicaid Other | Attending: Obstetrics & Gynecology | Admitting: Obstetrics & Gynecology

## 2022-08-05 ENCOUNTER — Other Ambulatory Visit: Payer: Self-pay

## 2022-08-05 ENCOUNTER — Ambulatory Visit (INDEPENDENT_AMBULATORY_CARE_PROVIDER_SITE_OTHER): Payer: Medicaid Other | Admitting: Certified Nurse Midwife

## 2022-08-05 ENCOUNTER — Encounter: Payer: Medicaid Other | Admitting: Certified Nurse Midwife

## 2022-08-05 ENCOUNTER — Encounter (HOSPITAL_COMMUNITY): Payer: Self-pay | Admitting: Obstetrics & Gynecology

## 2022-08-05 ENCOUNTER — Inpatient Hospital Stay (HOSPITAL_COMMUNITY): Payer: Medicaid Other | Admitting: Anesthesiology

## 2022-08-05 VITALS — BP 117/77 | HR 69 | Wt 172.2 lb

## 2022-08-05 DIAGNOSIS — O99824 Streptococcus B carrier state complicating childbirth: Secondary | ICD-10-CM | POA: Diagnosis present

## 2022-08-05 DIAGNOSIS — Z87891 Personal history of nicotine dependence: Secondary | ICD-10-CM

## 2022-08-05 DIAGNOSIS — Z3A40 40 weeks gestation of pregnancy: Secondary | ICD-10-CM

## 2022-08-05 DIAGNOSIS — O288 Other abnormal findings on antenatal screening of mother: Secondary | ICD-10-CM

## 2022-08-05 DIAGNOSIS — R8271 Bacteriuria: Secondary | ICD-10-CM | POA: Diagnosis present

## 2022-08-05 DIAGNOSIS — Z30017 Encounter for initial prescription of implantable subdermal contraceptive: Secondary | ICD-10-CM

## 2022-08-05 DIAGNOSIS — Z3402 Encounter for supervision of normal first pregnancy, second trimester: Secondary | ICD-10-CM

## 2022-08-05 DIAGNOSIS — Z975 Presence of (intrauterine) contraceptive device: Secondary | ICD-10-CM

## 2022-08-05 DIAGNOSIS — O2686 Pruritic urticarial papules and plaques of pregnancy (PUPPP): Secondary | ICD-10-CM

## 2022-08-05 DIAGNOSIS — O480 Post-term pregnancy: Secondary | ICD-10-CM | POA: Diagnosis present

## 2022-08-05 DIAGNOSIS — Z3403 Encounter for supervision of normal first pregnancy, third trimester: Secondary | ICD-10-CM

## 2022-08-05 DIAGNOSIS — F419 Anxiety disorder, unspecified: Secondary | ICD-10-CM | POA: Diagnosis present

## 2022-08-05 DIAGNOSIS — Z3483 Encounter for supervision of other normal pregnancy, third trimester: Secondary | ICD-10-CM

## 2022-08-05 LAB — CBC
HCT: 33.2 % — ABNORMAL LOW (ref 36.0–46.0)
Hemoglobin: 10.9 g/dL — ABNORMAL LOW (ref 12.0–15.0)
MCH: 29.9 pg (ref 26.0–34.0)
MCHC: 32.8 g/dL (ref 30.0–36.0)
MCV: 91 fL (ref 80.0–100.0)
Platelets: 228 10*3/uL (ref 150–400)
RBC: 3.65 MIL/uL — ABNORMAL LOW (ref 3.87–5.11)
RDW: 14.5 % (ref 11.5–15.5)
WBC: 9.3 10*3/uL (ref 4.0–10.5)
nRBC: 0 % (ref 0.0–0.2)

## 2022-08-05 MED ORDER — LACTATED RINGERS IV SOLN: 500.0000 mL | INTRAVENOUS | Status: DC | PRN

## 2022-08-05 MED ORDER — OXYTOCIN BOLUS FROM INFUSION
333.0000 mL | Freq: Once | INTRAVENOUS | Status: AC
  Administered 2022-08-06: 333 mL via INTRAVENOUS

## 2022-08-05 MED ORDER — ONDANSETRON HCL 4 MG/2ML IJ SOLN
4.0000 mg | Freq: Four times a day (QID) | INTRAMUSCULAR | Status: DC | PRN
  Administered 2022-08-05 – 2022-08-06 (×2): 4 mg via INTRAVENOUS
  Filled 2022-08-05 (×3): qty 2

## 2022-08-05 MED ORDER — FENTANYL CITRATE (PF) 100 MCG/2ML IJ SOLN
100.0000 ug | INTRAMUSCULAR | Status: DC | PRN
  Administered 2022-08-05 (×2): 100 ug via INTRAVENOUS
  Filled 2022-08-05 (×2): qty 2

## 2022-08-05 MED ORDER — MISOPROSTOL 50MCG HALF TABLET
50.0000 ug | ORAL_TABLET | Freq: Once | ORAL | Status: AC
  Administered 2022-08-05: 50 ug via ORAL
  Filled 2022-08-05: qty 1

## 2022-08-05 MED ORDER — SOD CITRATE-CITRIC ACID 500-334 MG/5ML PO SOLN: 30.0000 mL | ORAL | Status: DC | PRN

## 2022-08-05 MED ORDER — TERBUTALINE SULFATE 1 MG/ML IJ SOLN
0.2500 mg | Freq: Once | INTRAMUSCULAR | Status: AC | PRN
  Administered 2022-08-06: 0.25 mg via SUBCUTANEOUS
  Filled 2022-08-05: qty 1

## 2022-08-05 MED ORDER — PHENYLEPHRINE 80 MCG/ML (10ML) SYRINGE FOR IV PUSH (FOR BLOOD PRESSURE SUPPORT)
80.0000 ug | PREFILLED_SYRINGE | INTRAVENOUS | Status: DC | PRN
  Administered 2022-08-06: 80 ug via INTRAVENOUS

## 2022-08-05 MED ORDER — OXYCODONE-ACETAMINOPHEN 5-325 MG PO TABS: 1.0000 | ORAL_TABLET | ORAL | Status: DC | PRN

## 2022-08-05 MED ORDER — DIPHENHYDRAMINE HCL 50 MG/ML IJ SOLN
12.5000 mg | INTRAMUSCULAR | Status: DC | PRN
  Administered 2022-08-06: 12.5 mg via INTRAVENOUS
  Filled 2022-08-05: qty 1

## 2022-08-05 MED ORDER — EPHEDRINE 5 MG/ML INJ: 10.0000 mg | INTRAVENOUS | Status: DC | PRN

## 2022-08-05 MED ORDER — OXYCODONE-ACETAMINOPHEN 5-325 MG PO TABS: 2.0000 | ORAL_TABLET | ORAL | Status: DC | PRN

## 2022-08-05 MED ORDER — ACETAMINOPHEN 325 MG PO TABS: 650.0000 mg | ORAL_TABLET | ORAL | Status: DC | PRN

## 2022-08-05 MED ORDER — LACTATED RINGERS IV SOLN
500.0000 mL | Freq: Once | INTRAVENOUS | Status: AC
  Administered 2022-08-05: 500 mL via INTRAVENOUS

## 2022-08-05 MED ORDER — PENICILLIN G POT IN DEXTROSE 60000 UNIT/ML IV SOLN
3.0000 10*6.[IU] | INTRAVENOUS | Status: DC
  Administered 2022-08-05 – 2022-08-06 (×6): 3 10*6.[IU] via INTRAVENOUS
  Filled 2022-08-05 (×6): qty 50

## 2022-08-05 MED ORDER — OXYTOCIN-SODIUM CHLORIDE 30-0.9 UT/500ML-% IV SOLN: 2.5000 [IU]/h | INTRAVENOUS | Status: DC

## 2022-08-05 MED ORDER — MISOPROSTOL 25 MCG QUARTER TABLET
25.0000 ug | ORAL_TABLET | Freq: Once | ORAL | Status: AC
  Administered 2022-08-05: 25 ug via VAGINAL
  Filled 2022-08-05: qty 1

## 2022-08-05 MED ORDER — SODIUM CHLORIDE 0.9 % IV SOLN
5.0000 10*6.[IU] | Freq: Once | INTRAVENOUS | Status: AC
  Administered 2022-08-05: 5 10*6.[IU] via INTRAVENOUS
  Filled 2022-08-05: qty 5

## 2022-08-05 MED ORDER — LACTATED RINGERS IV SOLN: INTRAVENOUS | Status: DC

## 2022-08-05 MED ORDER — LIDOCAINE HCL (PF) 1 % IJ SOLN: 30.0000 mL | INTRAMUSCULAR | Status: DC | PRN

## 2022-08-05 MED ORDER — PHENYLEPHRINE 80 MCG/ML (10ML) SYRINGE FOR IV PUSH (FOR BLOOD PRESSURE SUPPORT)
80.0000 ug | PREFILLED_SYRINGE | INTRAVENOUS | Status: DC | PRN
  Filled 2022-08-05: qty 10

## 2022-08-05 MED ORDER — FENTANYL-BUPIVACAINE-NACL 0.5-0.125-0.9 MG/250ML-% EP SOLN
12.0000 mL/h | EPIDURAL | Status: DC | PRN
  Administered 2022-08-05: 12 mL/h via EPIDURAL
  Filled 2022-08-05 (×2): qty 250

## 2022-08-05 NOTE — MAU Provider Note (Signed)
Event Date/Time   First Provider Initiated Contact with Patient 08/05/22 1604     S: Ms. Kristine Franco is a 21 y.o. G2P0010 at [redacted]w[redacted]d  who presents to MAU today complaining contractions q 5 minutes since 1400. She denies vaginal bleeding. She denies LOF. She reports normal fetal movement.    She was sent from the office due to a non reactive NST at term.   O: BP 120/76   Pulse 70   Temp 98.9 F (37.2 C)   Resp 20   LMP 10/27/2021   SpO2 100%  GENERAL: Well-developed, well-nourished female in no acute distress.  HEAD: Normocephalic, atraumatic.  CHEST: Normal effort of breathing, regular heart rate ABDOMEN: Soft, nontender, gravid  Cervical exam:  Dilation: 1 Effacement (%): 50 Station: Ballotable Presentation: Vertex (by bedside u/s with provider) Exam by:: Sharolyn Douglas, CNM   Fetal Monitoring: Baseline: 130 Variability: moderate Accelerations: 10x10 Decelerations: none Contractions: 5-7  Discussed with patient non reactive NST x2 and recommendation for IOL. Discussed that she will not be able to have a waterbirth due to need for continuous monitoring. Patient verbalized understanding.   Patient tearful with contractions and requesting cervical exam. Positive scalp stimulation with exam.  A: [facility]UP at [redacted]w[redacted]d  NRNST  P: -Admit to labor and delivery -Report called to D. [place], [place], [place] 08/05/2022 4:41 PM

## 2022-08-05 NOTE — Progress Notes (Signed)
Patient ID: Kristine Franco, female   DOB: [birth year], 21 y.o.   MRN: [id number] Breathing through contractions Got  some relief from Fentanyl Desires epidural when able  Vitals:   08/05/22 1653 08/05/22 1749 08/05/22 1827 08/05/22 1952  BP: 128/75 124/82 130/82 124/69  Pulse: 72 73 67 68  Resp: 18 18 18 17   Temp: 98.4 F (36.9 C)   98.3 F (36.8 C)  TempSrc: Oral   Oral  SpO2:      Weight: 78.1 kg     Height: 5\' 3"  (1.6 m)      FHR stable UCs q1.5-72min  Dilation: 3 Effacement (%): 70 Station: -2 Presentation: Vertex Exam by:: Glenice Bow, rnc  Epidural and observe Will probably not need much more augmentation  Seabron Spates, CNM

## 2022-08-05 NOTE — MAU Note (Signed)
.  Kristine Franco is a 21 y.o. at [redacted]w[redacted]d here in MAU reporting: Pt reports she was told to come in from the office. Pt reports she was told that her baby was not too much. Pt reports she is feeling normal movement.  Denies vaginal bleeding or LOF    Onset of complaint: today  Pain score: 3/10 intermittent ctx's There were no vitals filed for this visit.    Lab orders placed from triage:   none

## 2022-08-05 NOTE — Anesthesia Preprocedure Evaluation (Signed)
Anesthesia Evaluation  Patient identified by MRN, date of birth, ID band Patient awake    Reviewed: Allergy & Precautions, NPO status , Patient's Chart, lab work & pertinent test results  Airway Mallampati: II  TM Distance: >3 FB Neck ROM: Full    Dental no notable dental hx.    Pulmonary asthma , former smoker,    Pulmonary exam normal breath sounds clear to auscultation       Cardiovascular negative cardio ROS Normal cardiovascular exam Rhythm:Regular Rate:Normal     Neuro/Psych PSYCHIATRIC DISORDERS Anxiety Depression negative neurological ROS     GI/Hepatic negative GI ROS, Neg liver ROS,   Endo/Other  negative endocrine ROS  Renal/GU negative Renal ROS  negative genitourinary   Musculoskeletal negative musculoskeletal ROS (+)   Abdominal   Peds  Hematology negative hematology ROS (+)   Anesthesia Other Findings IOL for nonreactive NST   Reproductive/Obstetrics (+) Pregnancy                             Anesthesia Physical Anesthesia Plan  ASA: 2  Anesthesia Plan: Epidural   Post-op Pain Management:    Induction:   PONV Risk Score and Plan: Treatment may vary due to age or medical condition  Airway Management Planned: Natural Airway  Additional Equipment:   Intra-op Plan:   Post-operative Plan:   Informed Consent: I have reviewed the patients History and Physical, chart, labs and discussed the procedure including the risks, benefits and alternatives for the proposed anesthesia with the patient or authorized representative who has indicated his/her understanding and acceptance.       Plan Discussed with: Anesthesiologist  Anesthesia Plan Comments: (Patient identified. Risks, benefits, options discussed with patient including but not limited to bleeding, infection, nerve damage, paralysis, failed block, incomplete pain control, headache, blood pressure changes,  nausea, vomiting, reactions to medication, itching, and post partum back pain. Confirmed with bedside nurse the patient's most recent platelet count. Confirmed with the patient that they are not taking any anticoagulation, have any bleeding history or any family history of bleeding disorders. Patient expressed understanding and wishes to proceed. All questions were answered. )        Anesthesia Quick Evaluation

## 2022-08-05 NOTE — Progress Notes (Signed)
Pt presents for ROB. Induction scheduled 10/24

## 2022-08-05 NOTE — H&P (Signed)
Kristine Franco is a 21 y.o. female G2P0010  at [place]  presenting for IOL for non reactive NST at post term.     Nursing Staff Provider  Office Location  [facility] Dating    [facility] Valu.Nieves ] Traditional [ ]  Centering [ ]  Mom-Baby Dyad    Language  English Anatomy US    Flu Vaccine  06/25/22 Genetic/Carrier Screen  NIPS:    AFP:   normal Horizon:  TDaP Vaccine   Given 05/14/22 Hgb A1C or  GTT Early  Third trimester - normal 2hr  COVID Vaccine Not vaccinated   LAB RESULTS   Rhogam   Blood Type O/Positive/-- (04/12 1410)   Baby Feeding Plan Breast and bottle Antibody Negative (04/12 1410)  Contraception Nexplanon Rubella 2.71 (04/12 1410)  Circumcision Yes  RPR Non Reactive (07/27 1009)   Pediatrician  [facility] HBsAg Negative (04/12 1410)   Support Person FOB HCVAb   Prenatal Classes  HIV Non Reactive (07/27 1009)     BTL Consent  GBS   (For PCN allergy, check sensitivities)   VBAC Consent  Pap        DME Rx Valu.Nieves ] BP cuff Valu.Nieves ] Weight Scale Waterbirth  [ ]  Class [ ]  Consent [ ]  CNM visit  PHQ9 & GAD7 Valu.Nieves  ] new OB [ X ] 28 weeks  Valu.Nieves ] 36 weeks Induction  [ ]  Orders Entered [ ] Foley Y/N    OB History     Gravida  2   Para      Term      Preterm      AB  1   Living         SAB  1   IAB      Ectopic      Multiple      Live Births             Past Medical History:  Diagnosis Date   Acetaminophen overdose, intentional self-harm, initial encounter ([place]) 03/01/2019   Anxiety    Asthma 2019   MDD (major depressive disorder), severe ([place]) 03/04/2019   Past Surgical History:  Procedure Laterality Date   INCISION AND DRAINAGE OF PERITONSILLAR ABCESS N/A 06/23/2017   Procedure: INCISION AND DRAINAGE OF PERITONSILLAR ABCESS;  Surgeon: Helayne Seminole, MD;  Location: [facility] OR;  Service: ENT;  Laterality: N/A;   Family History: family history includes Diabetes in her maternal grandfather and maternal grandmother; Hypertension in her mother. Social History:  reports  that she quit smoking about 7 months ago. Her smoking use included cigars. She has never used smokeless tobacco. She reports that she does not currently use alcohol. She reports current drug use. Drug: Marijuana.     Maternal Diabetes: No Genetic Screening: Normal Maternal Ultrasounds/Referrals: Normal Fetal Ultrasounds or other Referrals:  None Maternal Substance Abuse:  Yes:  Type: Marijuana Significant Maternal Medications:  None Significant Maternal Lab Results:  Group B Strep positive Number of Prenatal Visits:greater than 3 verified prenatal visits Other Comments:  None  Review of Systems  Constitutional:  Negative for chills, fatigue and fever.  Eyes:  Negative for visual disturbance.  Respiratory:  Negative for shortness of breath.   Cardiovascular:  Negative for chest pain.  Gastrointestinal:  Positive for abdominal pain. Negative for vomiting.  Genitourinary:  Negative for difficulty urinating, dysuria, flank pain, pelvic pain, vaginal bleeding, vaginal discharge and vaginal pain.  Musculoskeletal:  Positive for back pain.  Neurological:  Negative for dizziness and  headaches.  Psychiatric/Behavioral: Negative.     Maternal Medical History:  Reason for admission: Nonreactive NST  Contractions: Onset was 3-5 hours ago.   Frequency: regular.   Perceived severity is mild.   Fetal activity: Perceived fetal activity is normal.   Prenatal complications: no prenatal complications Prenatal Complications - Diabetes: none.   Dilation: 3 Effacement (%): 70 Station: -2 Exam by:: Lafonda Mosses Ansah-Mensah, rnc Blood pressure 124/69, pulse 68, temperature 98.3 F (36.8 C), temperature source Oral, resp. rate 17, height 5\' 3"  (1.6 m), weight 78.1 kg, last menstrual period 10/27/2021, SpO2 100 %. Maternal Exam:  Uterine Assessment: Contraction strength is moderate.  Contraction frequency is regular.  Abdomen: Fetal presentation: vertex Pelvis: adequate for delivery.   Cervix: Cervix  evaluated by digital exam.     Fetal Exam Fetal Monitor Review: Mode: ultrasound.   Baseline rate: 135.  Variability: moderate (6-25 bpm).   Pattern: accelerations present and no decelerations.   Fetal State Assessment: Category I - tracings are normal.   Physical Exam Vitals and nursing note reviewed.  Constitutional:      Appearance: She is well-developed.  Cardiovascular:     Rate and Rhythm: Normal rate.     Heart sounds: Normal heart sounds.  Pulmonary:     Effort: Pulmonary effort is normal.     Breath sounds: Normal breath sounds.  Abdominal:     Palpations: Abdomen is soft.  Musculoskeletal:        General: Normal range of motion.     Cervical back: Normal range of motion.  Skin:    General: Skin is warm and dry.  Neurological:     Mental Status: She is alert and oriented to person, place, and time.  Psychiatric:        Behavior: Behavior normal.        Thought Content: Thought content normal.        Judgment: Judgment normal.     Prenatal labs: ABO, Rh: --/--/O POS (10/18 1656) Antibody: POS (10/18 1656) Rubella: 2.71 (04/12 1410) RPR: Non Reactive (07/27 1009)  HBsAg: Negative (04/12 1410)  HIV: Non Reactive (07/27 1009)  GBS: Positive/-- (08/30 0000)   Assessment/Plan: G2P0010  at [redacted]w[redacted]d  admitted for IOL for nonreactive NST GBS positive  Admit for IOL Cytotec x 1 dose given PCN for GBS Anticipate NSVD   [redacted]w[redacted]d 08/05/2022, 8:54 PM

## 2022-08-05 NOTE — Progress Notes (Signed)
   PRENATAL VISIT NOTE  Subjective:  Kristine Franco is a 21 y.o. G2P0010 at [redacted]w[redacted]d being seen today for ongoing prenatal care.  She is currently monitored for the following issues for this low-risk pregnancy and has Cannabis use disorder, mild, abuse; Anxiety; History of suicide attempt; Supervision of normal first pregnancy in second trimester; and GBS bacteriuria on their problem list.  Patient reports occasional backache and occasional contractions.  Contractions: Not present. Vag. Bleeding: None.  Movement: Present. Denies leaking of fluid.   The following portions of the patient's history were reviewed and updated as appropriate: allergies, current medications, past family history, past medical history, past social history, past surgical history and problem list.   Objective:   Vitals:   08/05/22 1353  BP: 117/77  Pulse: 69  Weight: 172 lb 3.2 oz (78.1 kg)    Fetal Status:     Movement: Present     General:  Alert, oriented and cooperative. Patient is in no acute distress.  Skin: Skin is warm and dry. No rash noted.   Cardiovascular: Normal heart rate noted  Respiratory: Normal respiratory effort, no problems with respiration noted  Abdomen: Soft, gravid, appropriate for gestational age.  Pain/Pressure: Present     Pelvic: Cervical exam deferred        Extremities: Normal range of motion.  Edema: Trace  Mental Status: Normal mood and affect. Normal behavior. Normal judgment and thought content.   Assessment and Plan:  Pregnancy: G2P0010 at [redacted]w[redacted]d 1. Supervision of normal first pregnancy in second trimester - Patient reports normal fetal movement  2. GBS bacteriuria - GBS Positive plan to treat in intrapartum with PenG  3. [redacted] weeks gestation of pregnancy - NST completed today. 120bpm with minimal variability. Occasional 10x10 aceels no decels. - Juice provided and 15x15 x1 observed. Variability still minimal. No decels. Contraction: x1  - Consulted Dr. Rip Harbour on NST  results and MD agrees to have patient present to MAU for evaluation.  - Recommended that patient be seen in MAU.  - Patient desires a waterbirth, reviewed with patient that tub is currently out of order and that if FHT is still not reactive patient may need to be induced early.  - C. Milta Deiters, CNM in MAU verbally notified of patient transfer.   Term labor symptoms and general obstetric precautions including but not limited to vaginal bleeding, contractions, leaking of fluid and fetal movement were reviewed in detail with the patient. Please refer to After Visit Summary for other counseling recommendations.   Return in about 1 week (around 08/12/2022) for LOB.  Future Appointments  Date Time Provider [place]  08/11/2022  6:45 AM [facility] None   Everette Dimauro Isaias Sakai) Rollene Rotunda, MSN, [place] for [facility]  08/05/22 3:18 PM

## 2022-08-06 DIAGNOSIS — O99824 Streptococcus B carrier state complicating childbirth: Secondary | ICD-10-CM

## 2022-08-06 DIAGNOSIS — Z3A40 40 weeks gestation of pregnancy: Secondary | ICD-10-CM

## 2022-08-06 DIAGNOSIS — O480 Post-term pregnancy: Secondary | ICD-10-CM

## 2022-08-06 LAB — RPR: RPR Ser Ql: NONREACTIVE

## 2022-08-06 MED ORDER — SODIUM CHLORIDE 0.9 % IV SOLN
25.0000 mg | Freq: Four times a day (QID) | INTRAVENOUS | Status: DC | PRN
  Administered 2022-08-06 (×2): 25 mg via INTRAVENOUS
  Filled 2022-08-06 (×2): qty 1

## 2022-08-06 MED ORDER — OXYTOCIN-SODIUM CHLORIDE 30-0.9 UT/500ML-% IV SOLN
1.0000 m[IU]/min | INTRAVENOUS | Status: DC
  Administered 2022-08-06: 6 m[IU]/min via INTRAVENOUS

## 2022-08-06 MED ORDER — DIPHENHYDRAMINE HCL 50 MG/ML IJ SOLN: 25.0000 mg | Freq: Once | INTRAMUSCULAR | Status: DC

## 2022-08-06 MED ORDER — OXYTOCIN-SODIUM CHLORIDE 30-0.9 UT/500ML-% IV SOLN
1.0000 m[IU]/min | INTRAVENOUS | Status: DC
  Administered 2022-08-06: 1 m[IU]/min via INTRAVENOUS

## 2022-08-06 MED ORDER — LIDOCAINE-EPINEPHRINE (PF) 2 %-1:200000 IJ SOLN
INTRAMUSCULAR | Status: DC | PRN
  Administered 2022-08-05: 5 mL via EPIDURAL

## 2022-08-06 MED ORDER — DIPHENHYDRAMINE HCL 25 MG PO CAPS: 25.0000 mg | ORAL_CAPSULE | Freq: Once | ORAL | Status: DC

## 2022-08-06 MED ORDER — OXYTOCIN-SODIUM CHLORIDE 30-0.9 UT/500ML-% IV SOLN
1.0000 m[IU]/min | INTRAVENOUS | Status: DC
  Administered 2022-08-06: 2 m[IU]/min via INTRAVENOUS
  Filled 2022-08-06: qty 500

## 2022-08-06 MED ORDER — TERBUTALINE SULFATE 1 MG/ML IJ SOLN: 0.2500 mg | Freq: Once | INTRAMUSCULAR | Status: DC | PRN

## 2022-08-06 MED ORDER — SODIUM CHLORIDE 0.9 % IV SOLN: 12.5000 mg | Freq: Once | INTRAVENOUS | Status: DC

## 2022-08-06 MED ORDER — LACTATED RINGERS AMNIOINFUSION: INTRAVENOUS | Status: DC

## 2022-08-06 NOTE — Progress Notes (Signed)
Kristine Franco is a 21 y.o. G2P0010 at [redacted]w[redacted]d by LMP admitted for IOL for Non-reactive NST.    Subjective: Patient resting well. Had some nausea and vomiting that was unrelieved with Zofran.   Objective: BP 112/77   Pulse 70   Temp (!) 97.4 F (36.3 C) (Oral)   Resp 16   Ht 5\' 3"  (1.6 m)   Wt 78.1 kg   LMP 10/27/2021   SpO2 100%   BMI 30.50 kg/m  I/O last 3 completed shifts: In: 2449.9 [I.V.:1930.5; Other:63.4; IV Piggyback:456] Out: -  Total I/O In: -  Out: 400 [Urine:400]  FHT:  FHR: 120 bpm, variability: minimal and moderate,  accelerations:  Present,  decelerations:  Present early and variables with contractions  UC:   regular, every 5-7 minutes, difficult to trace.  SVE:   Dilation: 7 (swollen) Effacement (%): 90 Station: 0 Exam by:: Shay Lamyra Malcolm,CNM  Labs: Lab Results  Component Value Date   WBC 9.3 08/05/2022   HGB 10.9 (L) 08/05/2022   HCT 33.2 (L) 08/05/2022   MCV 91.0 08/05/2022   PLT 228 08/05/2022     Assessment / Plan: Induction of labor due to Non-reactive NST at term, making slow progression. Patient had a IUPC earlier that became dislodged. IUPC replaced without difficulty.   Labor: Cervical exam with minimal change. Anterior cervix Noticeably swollen. Fetal head well applied no caput, but fetus feels asynclitic. Recommended frequent position changes and continue to titrate pitocin 2x2 appropriately as needed.   Fetal Wellbeing:  Category II- continue to monitor for signs of fetal distress.  Pain Control:  Epidural I/D:   GBS Neg  Anticipated MOD:  NSVD  Jacquiline Doe, CNM 08/06/2022, 5:46 PM

## 2022-08-06 NOTE — Progress Notes (Addendum)
Patient ID: Kristine Franco, female   DOB: 11-08-2000, 21 y.o.   MRN: [id number] Having more pain Epidural pump button pushed  Vitals:   08/06/22 0100 08/06/22 0130 08/06/22 0200 08/06/22 0230  BP: 128/73 115/67 109/67 116/70  Pulse: 83 82 79 78  Resp: 18     Temp: 98 F (36.7 C)     TempSrc: Oral     SpO2:      Weight:      Height:       FHR stable with small variable decels UCs coupling  Dilation: 4.5 Effacement (%): 80 Station: -2, -1 Presentation: Vertex Exam by:: Hansel Feinstein, cnm  Will start  Pitocin

## 2022-08-06 NOTE — Lactation Note (Signed)
This note was copied from a baby's chart. Lactation Consultation Note  Patient Name: Kristine Franco [id number] Date: 08/06/2022 Reason for consult: L&D Initial assessment;Primapara;Term Age:21 hours Assisted baby to the breast.baby cueing but tongue thrusting nipple out. Unable to maintain latch d/t tongue thrusting and licking nipple.  Explained to mom the baby is learning. Mom has good colostrum. Praised mom. LC feels that baby will be able to BF well when he stops tongue thrusting nipple out. LC used gloved finger attempting suck training. Baby would suckle on finger. Moves tongue outside of mouth well. Left for parents bonding.  Maternal Data Has patient been taught Hand Expression?: Yes Does the patient have breastfeeding experience prior to this delivery?: No  Feeding    LATCH Score Latch: Repeated attempts needed to sustain latch, nipple held in mouth throughout feeding, stimulation needed to elicit sucking reflex.  Audible Swallowing: None  Type of Nipple: Everted at rest and after stimulation  Comfort (Breast/Nipple): Soft / non-tender  Hold (Positioning): Assistance needed to correctly position infant at breast and maintain latch.  LATCH Score: 6   Lactation Tools Discussed/Used    Interventions Interventions: Adjust position;Assisted with latch;Support pillows;Skin to skin;Position options;Breast massage;Hand express;Breast compression  Discharge    Consult Status Consult Status: Follow-up from L&D Date: 08/07/22 Follow-up type: In-patient    Theodoro Kalata 08/06/2022, 11:21 PM

## 2022-08-06 NOTE — Progress Notes (Signed)
Melessa Santerre is a 21 y.o. G2P0010 at [redacted]w[redacted]d by LMP admitted for IOL for Non-reactive NST.    Subjective: Sitting in throne. No acute concerns.   Objective: BP 120/79   Pulse 68   Temp 97.9 F (36.6 C) (Oral)   Resp 17   Ht 5\' 3"  (1.6 m)   Wt 78.1 kg   LMP 10/27/2021   SpO2 100%   BMI 30.50 kg/m  I/O last 3 completed shifts: In: 2449.9 [I.V.:1930.5; Other:63.4; IV Piggyback:456] Out: 400 [Urine:400] No intake/output data recorded.  FHT:  115 bpm/mininmal variability/+accels UC:   minimal 2/2 turning pit off was having contractions ever 2-4 prior SVE:   Dilation: Lip/rim Effacement (%): 90 Station: 0 Exam by:: K.Louis-Charles, RN  Labs: Lab Results  Component Value Date   WBC 9.3 08/05/2022   HGB 10.9 (L) 08/05/2022   HCT 33.2 (L) 08/05/2022   MCV 91.0 08/05/2022   PLT 228 08/05/2022     Assessment / Plan: Induction of labor due to Non-reactive NST at term, making slow progression.  Labor: Pit stopped 2/2 recurrent late decels with prolonged decel. Plan to restart at 10pm. Give benadryl x1 for edematous anterior lip at that time.   Fetal Wellbeing:  Category II- continue to monitor for signs of fetal distress  Pain Control:  Epidural I/D:   GBS Neg  Anticipated MOD:  NSVD  Adylin Hankey Autry-Lott, DO 08/06/2022, 9:18 PM

## 2022-08-06 NOTE — Progress Notes (Signed)
Late Entry due to labor and Delivery Acuity:   Kristine Franco is a 21 y.o. G2P0010 at [redacted]w[redacted]d by LMP admitted for induction of labor due to Non-reactive NST.  Subjective: Patient resting well s/p epidural. Introductions briefly exchanged.    Objective: BP 115/80   Pulse (!) 160   Temp 98.3 F (36.8 C) (Oral)   Resp 16   Ht 5\' 3"  (1.6 m)   Wt 78.1 kg   LMP 10/27/2021   SpO2 100%   BMI 30.50 kg/m  I/O last 3 completed shifts: In: 2449.9 [I.V.:1930.5; Other:63.4; IV Piggyback:456] Out: -  Total I/O In: -  Out: 400 [Urine:400]  FHT:  FHR: 120 bpm, variability: moderate,  accelerations:  Present,  decelerations:  Absent UC:   irregular, every 3-5 minutes SVE: 5/80/-1 S. [facility] CNM      Labs: Lab Results  Component Value Date   WBC 9.3 08/05/2022   HGB 10.9 (L) 08/05/2022   HCT 33.2 (L) 08/05/2022   MCV 91.0 08/05/2022   PLT 228 08/05/2022    Assessment / Plan: Induction of labor due to non-reactive NST s/p SROM @ 2155 on 08/05/22.   Labor:  Minimal progression since SROM. Initiate pit 2x2 and titrate as needed.  Fetal Wellbeing:  Category I- continue to monitor for signs of fetal distress.  Pain Control:  Epidural I/D:   GBS Negative  Anticipated MOD:  NSVD  Jacquiline Doe, CNM 08/06/2022, 9:53 AM

## 2022-08-06 NOTE — Anesthesia Procedure Notes (Addendum)
Epidural Patient location during procedure: OB Start time: 08/05/2022 10:35 PM End time: 08/05/2022 10:45 PM  Staffing Anesthesiologist: Freddrick March, MD Performed: anesthesiologist   Preanesthetic Checklist Completed: patient identified, IV checked, risks and benefits discussed, monitors and equipment checked, pre-op evaluation and timeout performed  Epidural Patient position: sitting Prep: DuraPrep and site prepped and draped Patient monitoring: continuous pulse ox, blood pressure, heart rate and cardiac monitor Approach: midline Location: L3-L4 Injection technique: LOR air  Needle:  Needle type: Tuohy  Needle gauge: 17 G Needle length: 9 cm Needle insertion depth: 6 cm Catheter type: closed end flexible Catheter size: 19 Gauge Catheter at skin depth: 11 cm Test dose: negative  Assessment Sensory level: T8 Events: blood not aspirated, injection not painful, no injection resistance, no paresthesia and negative IV test  Additional Notes Patient identified. Risks/Benefits/Options discussed with patient including but not limited to bleeding, infection, nerve damage, paralysis, failed block, incomplete pain control, headache, blood pressure changes, nausea, vomiting, reactions to medication both or allergic, itching and postpartum back pain. Confirmed with bedside nurse the patient's most recent platelet count. Confirmed with patient that they are not currently taking any anticoagulation, have any bleeding history or any family history of bleeding disorders. Patient expressed understanding and wished to proceed. All questions were answered. Sterile technique was used throughout the entire procedure. Please see nursing notes for vital signs. Test dose was given through epidural catheter and negative prior to continuing to dose epidural or start infusion. Warning signs of high block given to the patient including shortness of breath, tingling/numbness in hands, complete motor  block, or any concerning symptoms with instructions to call for help. Patient was given instructions on fall risk and not to get out of bed. All questions and concerns addressed with instructions to call with any issues or inadequate analgesia.  Reason for block:procedure for pain

## 2022-08-06 NOTE — Discharge Summary (Signed)
Postpartum Discharge Summary    Patient Name: Kristine Franco DOB: 11-16-2000 MRN: [id number]  Date of admission: 08/05/2022 Delivery date:08/06/2022  Delivering provider: Gerlene Fee  Date of discharge: 08/08/2022  Admitting diagnosis: Post term pregnancy over 40 weeks [O48.0] Intrauterine pregnancy: [redacted]w[redacted]d    Secondary diagnosis:  Principal Problem:   Vaginal delivery Active Problems:   Post term pregnancy over 40 weeks  Additional problems: n/a    Discharge diagnosis: Term Pregnancy Delivered                                              Post partum procedures: n/a Augmentation: Pitocin Complications: None  Hospital course: Induction of Labor With Vaginal Delivery   21y.o. yo G2P0010 at 465w3das admitted to the hospital 08/05/2022 for induction of labor.  Indication for induction: Postdates.  Patient had an labor course complicated bynone Membrane Rupture Time/Date: 9:55 PM ,08/05/2022   Delivery Method:Vaginal, Spontaneous  Episiotomy: None  Lacerations:  Periurethral  Details of delivery can be found in separate delivery note.  Patient had a postpartum course complicated bynone. Patient is discharged home 08/08/22.  Newborn Data: Birth date:08/06/2022  Birth time:10:41 PM  Gender:Female  Living status:Living  Apgars:9 ,9  Weight:2466 g   Magnesium Sulfate received: No BMZ received: No Rhophylac:N/A MMR:N/A T-DaP:Given prenatally Flu: Yes Transfusion:No  Physical exam  Vitals:   08/07/22 1355 08/07/22 2115 08/08/22 0545 08/08/22 1457  BP: 129/84 120/83 129/85 138/84  Pulse: 78 74 60 66  Resp: _0 Temp: 98.3 F (36.8 C) 98.2 F (36.8 C) 97.7 F (36.5 C) 98 F (36.7 C)  TempSrc: Oral Oral Oral Oral  SpO2: 100% 99% 100% 100%  Weight:      Height:       General: alert, cooperative, and no distress Lochia: appropriate Uterine Fundus: firm Incision: N/A DVT Evaluation: Calf/Ankle edema is present Labs: Lab Results  Component Value  Date   WBC 14.2 (H) 08/07/2022   HGB 10.0 (L) 08/07/2022   HCT 30.7 (L) 08/07/2022   MCV 90.3 08/07/2022   PLT 220 08/07/2022      Latest Ref Rng & Units 03/20/2022   10:27 AM  CMP  Glucose 70 - 99 mg/dL 69   BUN 6 - 20 mg/dL 5   Creatinine 0.44 - 1.00 mg/dL 0.43   Sodium 135 - 145 mmol/L 136   Potassium 3.5 - 5.1 mmol/L 3.6   Chloride 98 - 111 mmol/L 107   CO2 22 - 32 mmol/L 21   Calcium 8.9 - 10.3 mg/dL 8.9    Edinburgh Score:    08/07/2022    9:25 AM  Edinburgh Postnatal Depression Scale Screening Tool  I have been able to laugh and see the funny side of things. 0  I have looked forward with enjoyment to things. 0  I have blamed myself unnecessarily when things went wrong. 0  I have been anxious or worried for no good reason. 1  I have felt scared or panicky for no good reason. 0  Things have been getting on top of me. 1  I have been so unhappy that I have had difficulty sleeping. 0  I have felt sad or miserable. 1  I have been so unhappy that I have been crying. 0  The thought of harming myself has occurred to me. 0  EdFlavia Shipperostnatal  Depression Scale Total 3     After visit meds:  Allergies as of 08/08/2022   No Known Allergies      Medication List     STOP taking these medications    Blood Pressure Kit Devi   Gojji Weight Scale Misc   hydrOXYzine 25 MG tablet Commonly known as: ATARAX   loratadine 10 MG tablet Commonly known as: Claritin   terconazole 0.8 % vaginal cream Commonly known as: TERAZOL 3   Vitafol Ultra 29-0.6-0.4-200 MG Caps       TAKE these medications    acetaminophen 325 MG tablet Commonly known as: Tylenol Take 2 tablets (650 mg total) by mouth every 4 (four) hours as needed (for pain scale < 4).   benzocaine-Menthol 20-0.5 % Aero Commonly known as: DERMOPLAST Apply 1 Application topically as needed for irritation (perineal discomfort).   furosemide 20 MG tablet Commonly known as: LASIX Take 1 tablet (20 mg total)  by mouth daily for 3 days. Start taking on: August 09, 2022   ibuprofen 600 MG tablet Commonly known as: ADVIL Take 1 tablet (600 mg total) by mouth every 6 (six) hours.   senna-docusate 8.6-50 MG tablet Commonly known as: Senokot-S Take 2 tablets by mouth daily. Start taking on: August 09, 2022         Discharge home in stable condition Infant Feeding: Bottle and Breast Infant Disposition:home with mother Discharge instruction: per After Visit Summary and Postpartum booklet. Activity: Advance as tolerated. Pelvic rest for 6 weeks.  Diet: routine diet Future Appointments:No future appointments.  Follow up Visit: Message sent to [facility] 10/21 [place]  Please schedule this patient for a In person postpartum visit in 4 weeks with the following provider: Any provider. Additional Postpartum F/U:Postpartum Depression checkup  Low risk pregnancy complicated by:  n/a Delivery mode:  Vaginal, Spontaneous  Anticipated Birth Control:  PP Nexplanon placed   08/08/2022 Shelda Pal, DO

## 2022-08-06 NOTE — Progress Notes (Signed)
Patient ID: Kristine Franco, female   DOB: 21-Feb-2001, 21 y.o.   MRN: [id number] Comfortable with epidural  Vitals:   08/05/22 2330 08/06/22 0000 08/06/22 0030 08/06/22 0040  BP: (!) 105/59 106/87 (!) 81/67 (!) 94/56  Pulse: 67  (!) 135 75  Resp:      Temp:      TempSrc:      SpO2:      Weight:      Height:       FHR with variable decels  UCs frequent  IUPC inserted and amnioinfusion ordered  Will give a dose of Terbutaline to rest uterus and then give a dose of ephedrine (has already had 578ml bolus) to optimize BP  Will watch

## 2022-08-07 ENCOUNTER — Encounter (HOSPITAL_COMMUNITY): Payer: Self-pay | Admitting: Obstetrics and Gynecology

## 2022-08-07 LAB — CBC
HCT: 30.7 % — ABNORMAL LOW (ref 36.0–46.0)
Hemoglobin: 10 g/dL — ABNORMAL LOW (ref 12.0–15.0)
MCH: 29.4 pg (ref 26.0–34.0)
MCHC: 32.6 g/dL (ref 30.0–36.0)
MCV: 90.3 fL (ref 80.0–100.0)
Platelets: 220 10*3/uL (ref 150–400)
RBC: 3.4 MIL/uL — ABNORMAL LOW (ref 3.87–5.11)
RDW: 14.6 % (ref 11.5–15.5)
WBC: 14.2 10*3/uL — ABNORMAL HIGH (ref 4.0–10.5)
nRBC: 0 % (ref 0.0–0.2)

## 2022-08-07 MED ORDER — ONDANSETRON HCL 4 MG PO TABS: 4.0000 mg | ORAL_TABLET | ORAL | Status: DC | PRN

## 2022-08-07 MED ORDER — FUROSEMIDE 20 MG PO TABS
20.0000 mg | ORAL_TABLET | Freq: Every day | ORAL | Status: DC
  Administered 2022-08-07 – 2022-08-08 (×2): 20 mg via ORAL
  Filled 2022-08-07 (×2): qty 1

## 2022-08-07 MED ORDER — WITCH HAZEL-GLYCERIN EX PADS: 1.0000 | MEDICATED_PAD | CUTANEOUS | Status: DC | PRN

## 2022-08-07 MED ORDER — DIBUCAINE (PERIANAL) 1 % EX OINT: 1.0000 | TOPICAL_OINTMENT | CUTANEOUS | Status: DC | PRN

## 2022-08-07 MED ORDER — SENNOSIDES-DOCUSATE SODIUM 8.6-50 MG PO TABS
2.0000 | ORAL_TABLET | Freq: Every day | ORAL | Status: DC
  Administered 2022-08-07 – 2022-08-08 (×2): 2 via ORAL
  Filled 2022-08-07 (×2): qty 2

## 2022-08-07 MED ORDER — ACETAMINOPHEN 325 MG PO TABS: 650.0000 mg | ORAL_TABLET | ORAL | Status: DC | PRN

## 2022-08-07 MED ORDER — COCONUT OIL OIL: 1.0000 | TOPICAL_OIL | Status: DC | PRN

## 2022-08-07 MED ORDER — SIMETHICONE 80 MG PO CHEW: 80.0000 mg | CHEWABLE_TABLET | ORAL | Status: DC | PRN

## 2022-08-07 MED ORDER — TETANUS-DIPHTH-ACELL PERTUSSIS 5-2.5-18.5 LF-MCG/0.5 IM SUSY: 0.5000 mL | PREFILLED_SYRINGE | Freq: Once | INTRAMUSCULAR | Status: DC

## 2022-08-07 MED ORDER — IBUPROFEN 600 MG PO TABS
600.0000 mg | ORAL_TABLET | Freq: Four times a day (QID) | ORAL | Status: DC
  Administered 2022-08-07 – 2022-08-08 (×6): 600 mg via ORAL
  Filled 2022-08-07 (×6): qty 1

## 2022-08-07 MED ORDER — PRENATAL MULTIVITAMIN CH
1.0000 | ORAL_TABLET | Freq: Every day | ORAL | Status: DC
  Administered 2022-08-07 – 2022-08-08 (×2): 1 via ORAL
  Filled 2022-08-07 (×2): qty 1

## 2022-08-07 MED ORDER — BENZOCAINE-MENTHOL 20-0.5 % EX AERO
1.0000 | INHALATION_SPRAY | CUTANEOUS | Status: DC | PRN
  Administered 2022-08-07: 1 via TOPICAL
  Filled 2022-08-07 (×2): qty 56

## 2022-08-07 MED ORDER — DIPHENHYDRAMINE HCL 25 MG PO CAPS: 25.0000 mg | ORAL_CAPSULE | Freq: Four times a day (QID) | ORAL | Status: DC | PRN

## 2022-08-07 MED ORDER — ONDANSETRON HCL 4 MG/2ML IJ SOLN: 4.0000 mg | INTRAMUSCULAR | Status: DC | PRN

## 2022-08-07 NOTE — Clinical Social Work Maternal (Signed)
CLINICAL SOCIAL WORK MATERNAL/CHILD NOTE  Patient Details  Name: Kristine Franco MRN: [id number] Date of Birth: 12-16-2000  Date:  08/07/2022  Clinical Social Worker Initiating Note:  Letta Kocher, LCSWA Date/Time: Initiated:  08/07/22/1353     Child's Name:  Kristine Franco   Biological Parents:  Mother, Father Marschall Morrissey 2001/06/18, Nathaniel Man 05/15/1993)   Need for Interpreter:  None   Reason for Referral:  Current Substance Use/Substance Use During Pregnancy     Address:  [place] [address]    Phone number:  [phone] (home)     Additional phone number:   Household Members/Support Persons (HM/SP):       HM/SP Name Relationship DOB or Age  HM/SP -1        HM/SP -2        HM/SP -3        HM/SP -4        HM/SP -5        HM/SP -6        HM/SP -7        HM/SP -8          Natural Supports (not living in the home):  Spouse/significant other   Professional Supports:     Employment: Animator   Type of Work: [employer]   Education:  [employer] school graduate   Homebound arranged:    Museum/gallery curator Resources:  Medicaid   Other Resources:  [facility]   Cultural/Religious Considerations Which May Impact Care:    Strengths:  Ability to meet basic needs  , Compliance with medical plan  , Pediatrician chosen   Psychotropic Medications:         Pediatrician:    [place]  Pediatrician List:   [facility] ([address])  [place]      Pediatrician Fax Number:    Risk Factors/Current Problems:  Substance Use  , Mental Health Concerns     Cognitive State:  Able to Concentrate  , Alert     Mood/Affect:  Happy  , Comfortable     CSW Assessment: CSW received consult, CSW met with [name] to complete assessment and offer support. When CSW entered the room [name] was standing and rocking the infant, no visitors  present. CSW introduced self, CSW role and reason for visit. [name] was agreeable to visit. [name] was engaged with CSW and attentive to infants needs during the assessment. CSW inquired about how [name] was feeling, [name] reported she was doing "amazing, I don't even feel like I just had a baby" . [name] reported her delivery was easy but the labor was difficult. CSW validated [name] feelings and congratulated her in the birth of baby "Kristine Franco".   CSW confirmed address and phone number with [name]. [name] reported the address and phone number listed were correct. CSW inquired about MH hx. [name] reported she was diagnosed with anxiety and depression in 2018 and PTSD and Bipolar in 2020. CSW inquired about treatment, [name] reported she was prescribed medication initially but did not like the way it made her feel, [name] stated "that is why I smoke weed" to help with her anxiety. CSW encouraged [name] to identify healthier ways to cope with MH symptoms, [name] reported she has a dog now that is more like an emotional support animal. CSW inquired about therapy services [name] reported  she tried it in the past but "it didn't work". CSW assessed for safety [name] denied any current SI or HI. [name] did confirm her last thought or attempt was in 2020 when she was hospitalized. CSW inquired about changes she has made since then, [name] reported she removed certain people from her life. [name] reported she is going on year 2 in her own apartment which has been beneficial to her [place]. CSW inquired about supports [name] reported FOB and her sisters are supports for her. CSW provided education regarding the baby blues period vs. perinatal mood disorders, discussed treatment and gave resources for mental health follow up if concerns arise.  CSW recommends self-evaluation during the postpartum time period using the New Mom Checklist from Postpartum Progress and encouraged [name] to contact a medical professional if symptoms are noted at any time.  CSW inquired about [name] THC use, [name]  reported her last use was 2 or 3 months ago. [name] reported she used daily to help her east and reduce symptoms of anxiety. CSW informed [name] of hospital drug screen policy, [name] voiced understanding. CSW notified [name] that infants UDS was positive for THC. CSW explained that a CPS report would be made due to the positive UDS, [name] voiced understanding.     CSW provided review of Sudden Infant Death Syndrome (SIDS) precautions. [name] reported she has all necessary items for the infant  including a Pack n Play for him to sleep. CSW inquired if [name] was interested in home based resources. [name] verbally agreed, [name] set an appointment with [place] for 11/9 @10am .CSW identifies no further need for intervention and no barriers to discharge at this time.  CSW Plan/Description:  No Further Intervention Required/No Barriers to Discharge, Sudden Infant Death Syndrome (SIDS) Education, Perinatal Mood and Anxiety Disorder (PMADs) Education, Other Information/Referral to [employer], Child Copy Report  , CSW Will Continue to Monitor Umbilical Cord Tissue Drug Screen Results and Make Report if Renata Caprice, LCSW 08/07/2022, 2:09 PM

## 2022-08-07 NOTE — Anesthesia Postprocedure Evaluation (Signed)
Anesthesia Post Note  Patient: Kristine Franco  Procedure(s) Performed: AN AD HOC LABOR EPIDURAL     Patient location during evaluation: Mother Baby Anesthesia Type: Epidural Level of consciousness: awake and alert Pain management: pain level controlled Vital Signs Assessment: post-procedure vital signs reviewed and stable Respiratory status: spontaneous breathing, nonlabored ventilation and respiratory function stable Cardiovascular status: stable Postop Assessment: no headache, no backache, epidural receding, no apparent nausea or vomiting, patient able to bend at knees, adequate PO intake and able to ambulate Anesthetic complications: no   No notable events documented.  Last Vitals:  Vitals:   08/07/22 0214 08/07/22 0602  BP: 123/74 123/86  Pulse: 68 70  Resp: 18 16  Temp: 36.8 C 36.8 C  SpO2: 97% 99%    Last Pain:  Vitals:   08/07/22 0602  TempSrc: Oral  PainSc: 0-No pain   Pain Goal:                   [employer]

## 2022-08-07 NOTE — Lactation Note (Addendum)
This note was copied from a baby's chart. Lactation Consultation Note  Patient Name: Kristine Franco HMCNO'B Date: 08/07/2022 Reason for consult: Initial assessment Age:21 hours  P1, Mother was willing to demonstrate how she latches infant since he was cueing.  Mother knows how to hand express.  Baby had sucking burst. Discussed feeding and demand with cues and at least q 3 hours.  Discussed that if baby becomes sleepy at breast, mother should request being setup with DEBP.  Provided education. Mom made aware of O/P services, breastfeeding support groups, community resources, and our phone # for post-discharge questions.  Stork pump for [employer]. Stork pump denied. [facility] referral sent.  Maternal Data Has patient been taught Hand Expression?: Yes Does the patient have breastfeeding experience prior to this delivery?: No  Feeding Mother's Current Feeding Choice: Breast Milk  LATCH Score Latch: Grasps breast easily, tongue down, lips flanged, rhythmical sucking.  Audible Swallowing: A few with stimulation  Type of Nipple: Everted at rest and after stimulation  Comfort (Breast/Nipple): Soft / non-tender  Hold (Positioning): Assistance needed to correctly position infant at breast and maintain latch.  LATCH Score: 8   Lactation Tools Discussed/Used    Interventions Interventions: Breast feeding basics reviewed;Skin to skin;Education;LC Services brochure  Discharge Pump: Stork Pump [place] sent)  Consult Status Consult Status: Follow-up Date: 08/08/22 Follow-up type: In-patient    [name] [facility] 08/07/2022, 10:42 AM

## 2022-08-07 NOTE — Progress Notes (Addendum)
Patient ID: Kristine Franco, female   DOB: Mar 28, 2001, 21 y.o.   MRN: [id number] POSTPARTUM PROGRESS NOTE  Post Partum Day 1  Subjective:  Kristine Franco is a 21 y.o. G2P1011 s/p SVD at [redacted]w[redacted]d.  She reports she is doing well. No acute events overnight. She denies any problems with ambulating, voiding or po intake. Denies nausea or vomiting.  Pain is well controlled.  Lochia is small.  Objective: Blood pressure 123/86, pulse 70, temperature 98.2 F (36.8 C), temperature source Oral, resp. rate 16, height 5\' 3"  (1.6 m), weight 78.1 kg, last menstrual period 10/27/2021, SpO2 99 %, unknown if currently breastfeeding.  Physical Exam:  General: alert, cooperative and no distress Chest: no respiratory distress Heart:regular rate, distal pulses intact Abdomen: soft, nontender,  Uterine Fundus: firm, appropriately tender DVT Evaluation: No calf swelling or tenderness Extremities: no edema Skin: warm, dry  Recent Labs    08/05/22 1656 08/07/22 0433  HGB 10.9* 10.0*  HCT 33.2* 30.7*    Assessment/Plan: Kristine Franco is a 21 y.o. G2P1011 s/p SVD at [redacted]w[redacted]d   PPD#1 - Doing well Routine postpartum care Contraception: Nexplanon Feeding: Breast feeding Dispo: Plan for discharge today.   LOS: 2 days   Sandi Carne, SNM 08/07/2022, 7:22 AM   GME ATTESTATION:  I saw and evaluated the patient. I agree with the findings and the plan of care as documented in the student's note. I have made changes to documentation as necessary.  Gerlene Fee, DO OB Fellow, [place] for [place] 08/07/2022, 7:44 AM

## 2022-08-08 ENCOUNTER — Encounter (HOSPITAL_COMMUNITY): Payer: Self-pay | Admitting: Obstetrics and Gynecology

## 2022-08-08 DIAGNOSIS — Z975 Presence of (intrauterine) contraceptive device: Secondary | ICD-10-CM

## 2022-08-08 DIAGNOSIS — Z30017 Encounter for initial prescription of implantable subdermal contraceptive: Secondary | ICD-10-CM

## 2022-08-08 HISTORY — PX: NEXPLANON TRAY: NUR84248

## 2022-08-08 MED ORDER — IBUPROFEN 600 MG PO TABS: 600.0000 mg | ORAL_TABLET | Freq: Four times a day (QID) | ORAL | 0 refills | Status: DC

## 2022-08-08 MED ORDER — FUROSEMIDE 20 MG PO TABS: 20.0000 mg | ORAL_TABLET | Freq: Every day | ORAL | 0 refills | Status: DC

## 2022-08-08 MED ORDER — SENNOSIDES-DOCUSATE SODIUM 8.6-50 MG PO TABS: 2.0000 | ORAL_TABLET | Freq: Every day | ORAL | 0 refills | Status: DC

## 2022-08-08 MED ORDER — LIDOCAINE HCL 1 % IJ SOLN
INTRAMUSCULAR | Status: AC
  Administered 2022-08-08: 20 mL via INTRADERMAL
  Filled 2022-08-08: qty 20

## 2022-08-08 MED ORDER — BENZOCAINE-MENTHOL 20-0.5 % EX AERO: 1.0000 | INHALATION_SPRAY | CUTANEOUS | 0 refills | Status: DC | PRN

## 2022-08-08 MED ORDER — ETONOGESTREL 68 MG ~~LOC~~ IMPL
68.0000 mg | DRUG_IMPLANT | Freq: Once | SUBCUTANEOUS | Status: AC
  Administered 2022-08-08: 68 mg via SUBCUTANEOUS
  Filled 2022-08-08: qty 1

## 2022-08-08 MED ORDER — ACETAMINOPHEN 325 MG PO TABS: 650.0000 mg | ORAL_TABLET | ORAL | 0 refills | Status: DC | PRN

## 2022-08-08 MED ORDER — LIDOCAINE HCL 1 % IJ SOLN: 0.0000 mL | Freq: Once | INTRAMUSCULAR | Status: AC | PRN

## 2022-08-08 NOTE — Lactation Note (Signed)
This note was copied from a baby's chart. Lactation Consultation Note  Patient Name: Kristine Franco KPTWS'F Date: 08/08/2022 Reason for consult: 1st time breastfeeding;Term;Infant weight loss;Infant < 6lbs (-5% weight loss, seen by SLP, using Dr. Saul Fordyce Bottle with white NFant nipple, Per Birth Parent infant is feeding better with this bottle and nipple.) Age:21 hours Per Birth Parent, infant has been BF every hour since his circumcision. Per Birth Parent infant recently BF at 1625 pm for 10 minutes and given 5 mls of EBM. LC discussed infant will burn to many calories, to BF  infant 8x times within 24 hours, STS. Limit total feeding  BF and bottle to 30 minutes or less, based on infant's birth weight to offer (18 to 20 mls  of EBM) per feeding after latching infant at the breast. Encouraged to continue to use DEBP every 3 hours for 15 minutes and give infant back her EBM.  Birth Parent knows to call [place] if their are any BF questions, concerns or need latch assistance.  Maternal Data    Feeding Mother's Current Feeding Choice: Breast Milk  LATCH Score                    Lactation Tools Discussed/Used    Interventions Interventions: Education;Pace feeding  Discharge    Consult Status Consult Status: Follow-up Date: 08/09/22 Follow-up type: In-patient    Vicente Serene 08/08/2022, 6:25 PM

## 2022-08-08 NOTE — Procedures (Signed)
Post-Placental Nexplanon Insertion Procedure Note  Patient was identified. Informed consent was signed, signed copy in chart. A time-out was performed.    The insertion site was identified 8-10 cm (3-4 inches) from the medial epicondyle of the humerus and 3-5 cm (1.25-2 inches) posterior to (below) the sulcus (groove) between the biceps and triceps muscles of the patient's left arm and marked. The site was prepped and draped in the usual sterile fashion. Pt was prepped with alcohol swab and then injected with 3 cc of 1% lidocaine. The site was prepped with betadine. Nexplanon removed form packaging,  Device confirmed in needle, then inserted full length of needle and withdrawn per handbook instructions. Provider and patient verified presence of the implant in the woman's arm by palpation. Pt insertion site was covered with steristrips/adhesive bandage and pressure bandage. There was minimal blood loss. Patient tolerated procedure well.  Patient was given post procedure instructions and Nexplanon user card with expiration date. Condoms were recommended for STI prevention. Patient was asked to keep the pressure dressing on for 24 hours to minimize bruising and keep the adhesive bandage on for 3-5 days. The patient verbalized understanding of the plan of care and agrees.   Lot # [id number] Expiration Date 2025-09  Shelda Pal, [place] Fellow, Faculty practice [place] for [facility] 08/08/22  3:51 PM

## 2022-08-08 NOTE — Lactation Note (Addendum)
This note was copied from a baby's chart. Lactation Consultation Note  Patient Name: Kristine Franco GYIRS'W Date: 08/08/2022 Reason for consult: Follow-up assessment;Infant < 6lbs;1st time breastfeeding Age:21 hours  P1, Baby [redacted]w[redacted]d GA, < 6 lbs.   Baby cluster fed last night  He was recently circumcised and is now not as interested in feeding.  Recently took 5 ml and 3 ml of breastmilk with bottle and latched briefly at breast. Started mother pumping with DEBP.  Mother pumped 10 ml.  Earlier today she pumped 15 ml with hand pump.  She has a crack at base of R nipple and is using coconut oil.  She is also lubricating flange with coconut oil. Observed latch and demonstrated how to latch in cross cradle hold instead of cradle hold for improved head support and depth. Offered [facility] loaner and provided paperwork.  [place] referral has been sent. Stork pump was declined by insurance. Discussed with Caryl Pina RN that baby may benefit from SLP consult to advise on nipple type.  Feeding Mother's Current Feeding Choice: Breast Milk and Formula  LATCH Score Latch: Repeated attempts needed to sustain latch, nipple held in mouth throughout feeding, stimulation needed to elicit sucking reflex.  Audible Swallowing: A few with stimulation  Type of Nipple: Everted at rest and after stimulation  Comfort (Breast/Nipple): Soft / non-tender  Hold (Positioning): Assistance needed to correctly position infant at breast and maintain latch.  LATCH Score: 7   Lactation Tools Discussed/Used Tools: Pump Breast pump type: Double-Electric Breast Pump;Manual Pump Education: Setup, frequency, and cleaning;Milk Storage Reason for Pumping: stimulation and supplementation Pumping frequency: q 3 hours Pumped volume: 10 mL  Interventions Interventions: Breast feeding basics reviewed;Assisted with latch;Skin to skin;DEBP;Education  Discharge Pump: WIC Loaner Product manager [facility] loaner)  Consult Status Consult Status:  Follow-up Date: 08/09/22 Follow-up type: In-patient    [name] [facility] 08/08/2022, 12:01 PM

## 2022-08-09 ENCOUNTER — Ambulatory Visit: Payer: Self-pay

## 2022-08-09 LAB — TYPE AND SCREEN
ABO/RH(D): O POS
Antibody Screen: POSITIVE
Donor AG Type: NEGATIVE
Donor AG Type: NEGATIVE
Unit division: 0
Unit division: 0

## 2022-08-09 LAB — BPAM RBC
Blood Product Expiration Date: 202310262359
Blood Product Expiration Date: 202310262359
Unit Type and Rh: 5100
Unit Type and Rh: 5100

## 2022-08-09 NOTE — Lactation Note (Signed)
This note was copied from a baby's chart. Lactation Consultation Note  Patient Name: Kristine Franco ZOXWR'U Date: 08/09/2022 Reason for consult: Follow-up assessment;1st time breastfeeding;Primapara;Term;Infant < 6lbs Age:21 hours  LC in to visit with P72 Mom of term baby. Baby at a 4% weight loss which is an increase in 20 gms from yesterday.  Baby has been latching to the breast and supplementing with EBM.  Last pumping session, Mom expressed 60 ml.  Talked about dividing the milk up and storing some for next feeding.  Baby was on the breast in cradle hold, swallows audible.    WIC loaner pump provided.  Mom is interested in OP lactation F/U.     LATCH Score Latch: Grasps breast easily, tongue down, lips flanged, rhythmical sucking.  Audible Swallowing: Spontaneous and intermittent  Type of Nipple: Everted at rest and after stimulation  Comfort (Breast/Nipple): Soft / non-tender  Hold (Positioning): No assistance needed to correctly position infant at breast.  LATCH Score: 10   Lactation Tools Discussed/Used Tools: Pump;Flanges Flange Size: 24 Breast pump type: Manual;Double-Electric Breast Pump Pump Education: Setup, frequency, and cleaning;Milk Storage Reason for Pumping: Support milk supply/<6lb baby Pumping frequency: After each feeding Pumped volume: 60 mL  Interventions Interventions: Breast feeding basics reviewed;Skin to skin;Hand pump;DEBP;Pace feeding;Education  Discharge Discharge Education: Engorgement and breast care;Warning signs for feeding baby;Outpatient recommendation;Outpatient Epic message sent Pump: [facility] Program: Yes  Consult Status Consult Status: Complete Date: 08/09/22 Follow-up type: [place] 08/09/2022, 10:53 AM

## 2022-08-11 ENCOUNTER — Inpatient Hospital Stay (HOSPITAL_COMMUNITY): Payer: Medicaid Other

## 2022-08-11 ENCOUNTER — Inpatient Hospital Stay (HOSPITAL_COMMUNITY)
Admission: AD | Admit: 2022-08-11 | Payer: Medicaid Other | Source: Home / Self Care | Admitting: Obstetrics & Gynecology

## 2022-08-19 ENCOUNTER — Telehealth (HOSPITAL_COMMUNITY): Payer: Self-pay | Admitting: *Deleted

## 2022-08-19 NOTE — Telephone Encounter (Signed)
Left phone voicemail message.  Odis Hollingshead, RN 08-19-2022 at 1:38pm

## 2022-09-17 ENCOUNTER — Telehealth: Payer: Medicaid Other | Admitting: Certified Nurse Midwife

## 2022-09-17 ENCOUNTER — Institutional Professional Consult (permissible substitution): Payer: Medicaid Other | Admitting: Licensed Clinical Social Worker

## 2022-09-17 NOTE — Progress Notes (Deleted)
Provider location: [facility] {Blank single:19197::"[facility]","[facility]"}   Patient location: Home  I connected withNAME@ on 09/17/22 at  1:10 PM EST by Mychart Video Encounter and verified that I am speaking with the correct person using two identifiers.       I discussed the limitations, risks, security and privacy concerns of performing an evaluation and management service virtually and the availability of in person appointments. I also discussed with the patient that there may be a patient responsible charge related to this service. The patient expressed understanding and agreed to proceed.  Post Partum Visit Note Subjective:   Kristine Franco is a 21 y.o. G77P1011 female who presents for a postpartum visit. She is {1-10:13787} {time; units:18646} postpartum following a {method of delivery:313099}.  I have fully reviewed the prenatal and intrapartum course. The delivery was at *** gestational weeks.  Anesthesia: {anesthesia types:812}. Postpartum course has been ***. Baby is doing well***. Baby is feeding by {breast/bottle:69}. Bleeding {vag bleed:12292}. Bowel function is {normal:32111}. Bladder function is {normal:32111}. Patient {is/is not:9024} sexually active. Contraception method is {contraceptive method:5051}. Postpartum depression screening: {gen negative/positive:315881}.   The pregnancy intention screening data noted above was reviewed. Potential methods of contraception were discussed. The patient elected to proceed with No data recorded.    {Common ambulatory SmartLinks:19316}  Review of Systems {ros; complete:30496}  Objective:  There were no vitals taken for this visit.    General:  Alert, oriented and cooperative. Patient is in no acute distress.  Respiratory: Normal respiratory effort, no problems with respiration noted  Mental Status: Normal  mood and affect. Normal behavior. Normal judgment and thought content.  Rest of physical exam deferred due to type of encounter   Assessment:    *** postpartum exam.  Plan:  Essential components of care per ACOG recommendations:  1.  Mood and well being: Patient with {gen negative/positive:315881} depression screening today. Reviewed local resources for support.  - Patient {Action; does/does not:19097} use tobacco. ***If using tobacco we discussed reduction and for recently cessation risk of relapse - hx of drug use? {yes/no:20286}  *** If yes, discussed support systems  2. Infant care and feeding:  -Patient currently breastmilk feeding? {yes/no:20286} ***If breastmilk feeding discussed return to work and pumping. If needed, patient was provided letter for work to allow for every 2-3 hr pumping breaks, and to be granted a private location to express breastmilk and refrigerated area to store breastmilk. Reviewed importance of draining breast regularly to support lactation. -Social determinants of health (SDOH) reviewed in EPIC. No concerns***The following needs were identified***  3. Sexuality, contraception and birth spacing - Patient {DOES_DOES WEX:93716} want a pregnancy in the next year.  Desired family size is {NUMBER 1-10:22536} children.  - Reviewed forms of contraception in tiered fashion. Patient desired {PLAN CONTRACEPTION:313102} today.   - Discussed birth spacing of 18 months  4. Sleep and fatigue -Encouraged family/partner/community support of 4 hrs of uninterrupted sleep to help with mood and fatigue  5. Physical Recovery  - Discussed patients delivery*** and complications - Patient had a *** degree laceration, perineal healing reviewed. Patient expressed understanding - Patient has urinary incontinence? {yes/no:20286}*** Patient was referred to pelvic floor PT  - Patient {ACTION; IS/IS RCV:89381017} safe to resume physical and sexual activity  6.  Health Maintenance -  Last pap smear done *** and was {Normal/abnormal wildcard:19619} with negative HPV. ***Mammogram  7. ***Chronic Disease - PCP follow up  I provided *** minutes of face-to-face  time during this encounter.    No follow-ups on file.  No future appointments.  Flonnie Hailstone, [place] [facility] [place] [place]

## 2022-09-18 ENCOUNTER — Telehealth (INDEPENDENT_AMBULATORY_CARE_PROVIDER_SITE_OTHER): Payer: Medicaid Other | Admitting: Obstetrics and Gynecology

## 2022-09-18 ENCOUNTER — Encounter: Payer: Self-pay | Admitting: Obstetrics and Gynecology

## 2022-09-18 NOTE — Patient Instructions (Signed)

## 2022-09-18 NOTE — Progress Notes (Signed)
Provider location: [facility]   Patient location: Home  I connected withNAME@ on 09/18/22 at 10:55 AM EST by Mychart Video Encounter and verified that I am speaking with the correct person using two identifiers.       I discussed the limitations, risks, security and privacy concerns of performing an evaluation and management service virtually and the availability of in person appointments. I also discussed with the patient that there may be a patient responsible charge related to this service. The patient expressed understanding and agreed to proceed.  Post Partum Visit Note Subjective:   Valentina Alcoser is a 21 y.o. G11P1011 female who presents for a postpartum visit. She is 6 weeks postpartum following a normal spontaneous vaginal delivery.  I have fully reviewed the prenatal and intrapartum course. The delivery was at 40.3 gestational weeks.  Anesthesia: epidural. Postpartum course has been unremarkable. Baby is doing well. Baby is feeding by Breast. Bleeding staining only. Bowel function is normal. Bladder function is normal. Patient is not sexually active. Contraception method is Nexplanon. Postpartum depression screening: negative.   Upstream - 09/18/22 1124       Pregnancy Intention Screening   Does the patient want to become pregnant in the next year? No    Does the patient's partner want to become pregnant in the next year? No    Would the patient like to discuss contraceptive options today? No      Contraception Wrap Up   Current Method Hormonal Implant            The pregnancy intention screening data noted above was reviewed. Potential methods of contraception were discussed. The patient elected to proceed with No data recorded.   Edinburgh Postnatal Depression Scale - 09/18/22 1125       Edinburgh Postnatal Depression Scale:  In the Past 7 Days   I have been able to laugh and see the funny side of things. 0    I have looked forward  with enjoyment to things. 0    I have blamed myself unnecessarily when things went wrong. 0    I have been anxious or worried for no good reason. 0    I have felt scared or panicky for no good reason. 0    Things have been getting on top of me. 0    I have been so unhappy that I have had difficulty sleeping. 0    I have felt sad or miserable. 0    I have been so unhappy that I have been crying. 0    The thought of harming myself has occurred to me. 0    Edinburgh Postnatal Depression Scale Total 0             The following portions of the patient's history were reviewed and updated as appropriate: allergies, current medications, past family history, past medical history, past social history, past surgical history, and problem list.  Review of Systems Pertinent items noted in HPI and remainder of comprehensive ROS otherwise negative.  Objective:  BP 111/82   Pulse 68   Ht 5\' 3"  (1.6 m)   Breastfeeding Yes   BMI 30.50 kg/m     General:  Alert, oriented and cooperative. Patient is in no acute distress.  Respiratory: Normal respiratory effort, no problems with respiration noted  Mental Status: Normal mood and affect. Normal behavior. Normal judgment and thought content.  Rest of physical exam deferred due to type of encounter  Assessment:    Nl postpartum exam.  Plan:  Essential components of care per ACOG recommendations:  1.  Mood and well being: Patient with negative depression screening today. Reviewed local resources for support.  - Patient does not use tobacco. - hx of drug use? No    2. Infant care and feeding:  -Patient currently breastmilk feeding? Yes If breastmilk feeding discussed return to work and pumping. If needed, patient was provided letter for work to allow for every 2-3 hr pumping breaks, and to be granted a private location to express breastmilk and refrigerated area to store breastmilk. Reviewed importance of draining breast regularly to support  lactation.  -Social determinants of health (SDOH) reviewed in EPIC.   3. Sexuality, contraception and birth spacing - Patient does not want a pregnancy in the next year.  Desired family size is uncertain .  - Reviewed forms of contraception in tiered fashion. Patient desired  Nexplanon placed 08/08/22   - Discussed birth spacing of 18 months  4. Sleep and fatigue -Encouraged family/partner/community support of 4 hrs of uninterrupted sleep to help with mood and fatigue  5. Physical Recovery  - Discussed patients delivery and complications -- Patient has urinary incontinence? No  - Patient is safe to resume physical and sexual activity  6.  Health Maintenance - Last pap smear done 4/23 and was normal with negative HPV.   I provided 8 minutes of face-to-face time during this encounter.    Arlina Robes, MD  [facility] [place] [place]

## 2022-09-19 NOTE — Progress Notes (Signed)
Patient did not show for appt.   Kristine Franco Danella Deis) Suzie Portela, MSN, CNM  [facility]  09/19/22 11:51 AM

## 2022-11-17 ENCOUNTER — Encounter: Payer: Self-pay | Admitting: Obstetrics and Gynecology

## 2023-01-30 ENCOUNTER — Ambulatory Visit (HOSPITAL_COMMUNITY)
Admission: EM | Admit: 2023-01-30 | Discharge: 2023-01-30 | Disposition: A | Discharge status: Home / Self Care | Payer: Medicaid Other | Attending: Nurse Practitioner | Admitting: Nurse Practitioner

## 2023-01-30 ENCOUNTER — Encounter (HOSPITAL_COMMUNITY): Payer: Self-pay

## 2023-01-30 DIAGNOSIS — N764 Abscess of vulva: Secondary | ICD-10-CM | POA: Diagnosis present

## 2023-01-30 DIAGNOSIS — Z113 Encounter for screening for infections with a predominantly sexual mode of transmission: Secondary | ICD-10-CM | POA: Diagnosis present

## 2023-01-30 LAB — HIV ANTIBODY (ROUTINE TESTING W REFLEX): HIV Screen 4th Generation wRfx: NONREACTIVE

## 2023-01-30 MED ORDER — SULFAMETHOXAZOLE-TRIMETHOPRIM 800-160 MG PO TABS: 1.0000 | ORAL_TABLET | Freq: Two times a day (BID) | ORAL | 0 refills | Status: AC

## 2023-01-30 NOTE — ED Triage Notes (Signed)
Has a bump on the vaginal area that has come to a head, no drainage. Patient has not shaved there in a while. Onset this morning. Located on the outer right labia.  No history of boils or abscesses.

## 2023-01-30 NOTE — Discharge Instructions (Signed)
Will call you if any of the testing today comes back positive.  We have drained the abscess near your vagina today.  Please clean the area twice daily with mild soap and water.  This should continue to drain for the next couple of days.  Take the antibiotic as prescribed to clear up the rest of the infection and use warm compresses/sitz baths to help draw out infection.

## 2023-01-30 NOTE — ED Provider Notes (Signed)
[facility]    CSN: [id number] Arrival date & time: 01/30/23  1251      History   Chief Complaint Chief Complaint  Patient presents with   Mass    Vaginal area.     HPI Kristine Franco is a 22 y.o. female.   Patient presents today for raised and swollen bump to her vagina that she noticed first thing this morning.  She denies fever, but aches or chills.  No new vaginal discharge or new sexual partner.  She reports looks like there is pus inside of it that needs to come out.  No history of abscesses.  She is requesting STI testing today, denies known exposures.  No abdominal pain, nausea/vomiting, other vaginal sores or lesions.  Denies antibiotic use in the past 90 days.  She is currently breast-feeding her 5-month-old infant.    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    Patient Active Problem List   Diagnosis Date Noted   Postpartum care following vaginal delivery 09/18/2022   Nexplanon in place    History of suicide attempt    Anxiety 03/01/2019   Cannabis use disorder, mild, abuse     Past Surgical History:  Procedure Laterality Date   INCISION AND DRAINAGE OF PERITONSILLAR ABCESS N/A 06/23/2017   Procedure: INCISION AND DRAINAGE OF PERITONSILLAR ABCESS;  Surgeon: Graylin Shiver, MD;  Location: [facility];  Service: ENT;  Laterality: N/A;   NEXPLANON TRAY  08/08/2022    OB History     Gravida  2   Para  1   Term  1   Preterm      AB  1   Living  1      SAB  1   IAB      Ectopic      Multiple  0   Live Births  1            Home Medications    Prior to Admission medications   Medication Sig Start Date End Date Taking? Authorizing Provider  sulfamethoxazole-trimethoprim (BACTRIM DS) 800-160 MG tablet Take 1 tablet by mouth 2 (two) times daily for 7 days. 01/30/23 02/06/23  Yes Valentino Nose, NP  Etonogestrel (NEXPLANON [place]) Inject into the skin.    [provider]    Family History Family History  Problem Relation Age of Onset   Hypertension Mother    Diabetes Maternal Grandmother    Diabetes Maternal Grandfather     Social History Social History   Tobacco Use   Smoking status: Former    Types: Cigars    Quit date: 12/16/2021    Years since quitting: 1.1   Smokeless tobacco: Never   Tobacco comments:    black & milds socially  Vaping Use   Vaping Use: Former   Quit date: 12/16/2021   Substances: Nicotine, Flavoring  Substance Use Topics   Alcohol use: Not Currently    Comment: social, prior to pregnancy   Drug use: Yes    Types: Marijuana    Comment: Pt states she smokes when shes in pain     Allergies   Patient has no known allergies.   Review of Systems Review of Systems Per HPI  Physical Exam Triage Vital Signs ED Triage Vitals  Enc Vitals Group     BP 01/30/23 1357 105/68  Pulse Rate 01/30/23 1357 65     Resp 01/30/23 1357 18     Temp 01/30/23 1357 98.7 F (37.1 C)     Temp Source 01/30/23 1357 Oral     SpO2 01/30/23 1357 96 %     Weight --      Height 01/30/23 1357  (1.6 m)     Head Circumference --      Peak Flow --      Pain Score 01/30/23 1355 5     Pain Loc --      Pain Edu? --      Excl. in GC? --    No data found.  Updated Vital Signs BP 105/68 (BP Location: Left Arm)   Pulse 65   Temp 98.7 F (37.1 C) (Oral)   Resp 18   Ht  (1.6 m)   LMP  (LMP Unknown)   SpO2 96%   Breastfeeding Yes   BMI 30.50 kg/m   Visual Acuity Right Eye Distance:   Left Eye Distance:   Bilateral Distance:    Right Eye Near:   Left Eye Near:    Bilateral Near:     Physical Exam Vitals and nursing note reviewed. Exam conducted with a chaperone present (Demetrice [place], CMA).  Constitutional:      General: She is not in acute distress.    Appearance: Normal appearance. She is not  toxic-appearing.  HENT:     Mouth/Throat:     Mouth: Mucous membranes are moist.     Pharynx: Oropharynx is clear.  Pulmonary:     Effort: Pulmonary effort is normal. No respiratory distress.  Genitourinary:    Exam position: Lithotomy position.     Pubic Area: No rash.      Labia:        Right: Lesion present. No rash, tenderness or injury.        Left: No rash, tenderness, lesion or injury.        Comments: Abscess to right external labia in area marked; approximately 0.25 x 0.25 cm.  There is tenderness to palpation and fluctuance.  No active drainage.  No vaginal drainage. Lymphadenopathy:     Lower Body: No right inguinal adenopathy. No left inguinal adenopathy.  Skin:    General: Skin is warm and dry.     Capillary Refill: Capillary refill takes less than 2 seconds.     Findings: Abscess present.  Neurological:     Mental Status: She is alert and oriented to person, place, and time.  Psychiatric:        Behavior: Behavior is cooperative.      UC Treatments / Results  Labs (all labs ordered are listed, but only abnormal results are displayed) Labs Reviewed  HSV CULTURE AND TYPING  HIV ANTIBODY (ROUTINE TESTING W REFLEX)  RPR  CERVICOVAGINAL ANCILLARY ONLY    EKG   Radiology No results found.  Procedures Incision and Drainage  Date/Time: 01/30/2023 2:38 PM  Performed by: Valentino Nose, NP Authorized by: Valentino Nose, NP   Consent:    Consent obtained:  Verbal   Consent given by:  Patient   Risks, benefits, and alternatives were discussed: yes     Risks discussed:  Pain, bleeding and infection   Alternatives discussed:  Delayed treatment Universal protocol:    Procedure explained and questions answered to patient or proxy's satisfaction: yes     Patient identity confirmed:  Verbally with patient Location:    Type:  Abscess   Size:  0.25 cm x 0.25 cm   Location:  Anogenital   Anogenital location:  Vulva Pre-procedure details:    Skin  preparation:  Chlorhexidine Sedation:    Sedation type:  None Anesthesia:    Anesthesia method:  Topical application   Topical anesthesia: Freeze spray. Procedure type:    Complexity:  Simple Procedure details:    Incision types:  Stab incision   Incision depth:  Dermal   Drainage:  Purulent and bloody   Drainage amount:  Scant   Wound treatment:  Wound left open   Packing materials:  None Post-procedure details:    Procedure completion:  Tolerated well, no immediate complications  (including critical care time)  Medications Ordered in UC Medications - No data to display  Initial Impression / Assessment and Plan / UC Course  I have reviewed the triage vital signs and the nursing notes.  Pertinent labs & imaging results that were available during my care of the patient were reviewed by me and considered in my medical decision making (see chart for details).   Patient is well-appearing, normotensive, afebrile, not tachycardic, not tachypneic, oxygenating well on room air.    1. Abscess of vulva I&D of abscess as above Start Bactrim DS twice daily for 7 days; discussed breastfeeding precautions Wound care discussed and recommended warm compresses/sitz bath's Seek care for persistent or worsening symptoms despite treatment  2. Screening examination for STD (sexually transmitted disease) Vaginal swab to check for gonorrhea, chlamydia, BV, yeast, and trichomonas HSV testing obtained of vaginal abscess, however I do suspect this was a bacterial abscess and not viral HIV and syphilis test is also pending  The patient was given the opportunity to ask questions.  All questions answered to their satisfaction.  The patient is in agreement to this plan.    Final Clinical Impressions(s) / UC Diagnoses   Final diagnoses:  Abscess of vulva  Screening examination for STD (sexually transmitted disease)     Discharge Instructions      Will call you if any of the testing today comes  back positive.  We have drained the abscess near your vagina today.  Please clean the area twice daily with mild soap and water.  This should continue to drain for the next couple of days.  Take the antibiotic as prescribed to clear up the rest of the infection and use warm compresses/sitz baths to help draw out infection.   ED Prescriptions     Medication Sig Dispense Auth. Provider   sulfamethoxazole-trimethoprim (BACTRIM DS) 800-160 MG tablet Take 1 tablet by mouth 2 (two) times daily for 7 days. 14 tablet Valentino Nose, NP      PDMP not reviewed this encounter.   Valentino Nose, NP 01/30/23 [phone]

## 2023-01-31 LAB — RPR: RPR Ser Ql: NONREACTIVE

## 2023-02-02 LAB — CERVICOVAGINAL ANCILLARY ONLY
Bacterial Vaginitis (gardnerella): POSITIVE — AB
Candida Glabrata: NEGATIVE
Candida Vaginitis: NEGATIVE
Chlamydia: NEGATIVE
Comment: NEGATIVE
Comment: NEGATIVE
Comment: NEGATIVE
Comment: NEGATIVE
Comment: NEGATIVE
Comment: NORMAL
Neisseria Gonorrhea: NEGATIVE
Trichomonas: NEGATIVE

## 2023-02-02 LAB — HSV CULTURE AND TYPING

## 2023-02-03 ENCOUNTER — Telehealth (HOSPITAL_COMMUNITY): Payer: Self-pay | Admitting: Emergency Medicine

## 2023-02-03 MED ORDER — METRONIDAZOLE 0.75 % VA GEL: 1.0000 | Freq: Every day | VAGINAL | 0 refills | Status: AC

## 2023-04-29 ENCOUNTER — Ambulatory Visit: Payer: Medicaid Other | Admitting: Obstetrics and Gynecology

## 2023-05-06 ENCOUNTER — Ambulatory Visit (INDEPENDENT_AMBULATORY_CARE_PROVIDER_SITE_OTHER): Payer: MEDICAID | Admitting: Obstetrics and Gynecology

## 2023-05-06 ENCOUNTER — Encounter: Payer: Self-pay | Admitting: Obstetrics and Gynecology

## 2023-05-06 ENCOUNTER — Other Ambulatory Visit (HOSPITAL_COMMUNITY)
Admission: RE | Admit: 2023-05-06 | Discharge: 2023-05-06 | Disposition: A | Discharge status: Home / Self Care | Payer: MEDICAID | Source: Ambulatory Visit | Attending: Obstetrics and Gynecology | Admitting: Obstetrics and Gynecology

## 2023-05-06 VITALS — BP 108/74 | HR 58 | Ht 63.0 in | Wt 138.1 lb

## 2023-05-06 DIAGNOSIS — Z202 Contact with and (suspected) exposure to infections with a predominantly sexual mode of transmission: Secondary | ICD-10-CM

## 2023-05-06 DIAGNOSIS — Z113 Encounter for screening for infections with a predominantly sexual mode of transmission: Secondary | ICD-10-CM

## 2023-05-06 NOTE — Progress Notes (Signed)
Pt is in the office complaining of vaginal irritation that began about a month ago and felt like a perineal tear. Pt states that symptoms have gotten better since then but she is still having itching today.  Last pap 01/28/22

## 2023-05-06 NOTE — Progress Notes (Signed)
Kristine Franco presents for STD testing. She would also like to be sure that the labial abscess that she had several months ago has resolved as well as perineum tear. Pap smear UTD  PE AF VSS Chaperone present  Lungs clear  Heart RRR Abd soft + BS GU nl EGBUS, vaginal swab collected for STD testing  A/P STD testing  Vaginal swab and blood collected for STD testing as per pt request. Abscess and tear have resolved. Pt reassure in that regards. F/U as per test results

## 2023-05-07 LAB — CERVICOVAGINAL ANCILLARY ONLY
Bacterial Vaginitis (gardnerella): NEGATIVE
Candida Glabrata: NEGATIVE
Candida Vaginitis: NEGATIVE
Chlamydia: NEGATIVE
Comment: NEGATIVE
Comment: NEGATIVE
Comment: NEGATIVE
Comment: NEGATIVE
Comment: NEGATIVE
Comment: NORMAL
Neisseria Gonorrhea: NEGATIVE
Trichomonas: NEGATIVE

## 2023-05-08 LAB — HIV ANTIBODY (ROUTINE TESTING W REFLEX): HIV Screen 4th Generation wRfx: NONREACTIVE

## 2023-05-08 LAB — HEPATITIS B SURFACE ANTIGEN: Hepatitis B Surface Ag: NEGATIVE

## 2023-05-08 LAB — HEPATITIS C ANTIBODY: Hep C Virus Ab: NONREACTIVE

## 2023-05-08 LAB — RPR: RPR Ser Ql: NONREACTIVE

## 2023-07-22 ENCOUNTER — Encounter (HOSPITAL_COMMUNITY): Payer: Self-pay

## 2023-07-22 ENCOUNTER — Emergency Department (HOSPITAL_COMMUNITY): Payer: MEDICAID

## 2023-07-22 ENCOUNTER — Emergency Department (HOSPITAL_COMMUNITY)
Admission: EM | Admit: 2023-07-22 | Discharge: 2023-07-22 | Disposition: A | Discharge status: Home / Self Care | Payer: MEDICAID | Attending: Emergency Medicine | Admitting: Emergency Medicine

## 2023-07-22 ENCOUNTER — Other Ambulatory Visit: Payer: Self-pay

## 2023-07-22 DIAGNOSIS — M545 Low back pain, unspecified: Secondary | ICD-10-CM | POA: Insufficient documentation

## 2023-07-22 DIAGNOSIS — M546 Pain in thoracic spine: Secondary | ICD-10-CM | POA: Diagnosis not present

## 2023-07-22 DIAGNOSIS — R0789 Other chest pain: Secondary | ICD-10-CM | POA: Diagnosis not present

## 2023-07-22 DIAGNOSIS — Y92410 Unspecified street and highway as the place of occurrence of the external cause: Secondary | ICD-10-CM | POA: Diagnosis not present

## 2023-07-22 LAB — POC URINE PREG, ED: Preg Test, Ur: NEGATIVE

## 2023-07-22 MED ORDER — IBUPROFEN 400 MG PO TABS
600.0000 mg | ORAL_TABLET | Freq: Once | ORAL | Status: AC
  Administered 2023-07-22: 600 mg via ORAL
  Filled 2023-07-22: qty 1

## 2023-07-22 MED ORDER — ACETAMINOPHEN 325 MG PO TABS
650.0000 mg | ORAL_TABLET | Freq: Once | ORAL | Status: AC
  Administered 2023-07-22: 650 mg via ORAL
  Filled 2023-07-22: qty 2

## 2023-07-22 MED ORDER — METHOCARBAMOL 500 MG PO TABS
1000.0000 mg | ORAL_TABLET | Freq: Three times a day (TID) | ORAL | 0 refills | Status: AC | PRN
Start: 2023-07-22 — End: ?
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
## 2024-10-19 ENCOUNTER — Emergency Department (HOSPITAL_COMMUNITY)
Admission: EM | Admit: 2024-10-19 | Discharge: 2024-10-19 | Disposition: A | Discharge status: Home / Self Care | Payer: Self-pay | Attending: Emergency Medicine | Admitting: Emergency Medicine

## 2024-10-19 DIAGNOSIS — J101 Influenza due to other identified influenza virus with other respiratory manifestations: Secondary | ICD-10-CM | POA: Insufficient documentation

## 2024-10-19 DIAGNOSIS — J069 Acute upper respiratory infection, unspecified: Secondary | ICD-10-CM

## 2024-10-19 DIAGNOSIS — J020 Streptococcal pharyngitis: Secondary | ICD-10-CM | POA: Insufficient documentation

## 2024-10-19 LAB — RESP PANEL BY RT-PCR (RSV, FLU A&B, COVID)  RVPGX2
Influenza A by PCR: POSITIVE — AB
Influenza B by PCR: NEGATIVE
Resp Syncytial Virus by PCR: NEGATIVE
SARS Coronavirus 2 by RT PCR: NEGATIVE

## 2024-10-19 LAB — GROUP A STREP BY PCR: Group A Strep by PCR: DETECTED — AB

## 2024-10-19 MED ORDER — AMOXICILLIN 500 MG PO CAPS: 500.0000 mg | ORAL_CAPSULE | Freq: Two times a day (BID) | ORAL | 0 refills | Status: AC

## 2024-10-19 MED ORDER — DM-GUAIFENESIN ER 30-600 MG PO TB12: 1.0000 | ORAL_TABLET | Freq: Two times a day (BID) | ORAL | 0 refills | Status: AC

## 2024-10-19 NOTE — Discharge Instructions (Signed)
 Thank you for letting us  evaluate you today.  Your vital signs here in emergency department are normal.  You tested positive for strep.  Have sent an antibiotic in for this.  Please take twice a day for 10 days and do not miss a dose.  You should not have really leftover.  I would recommend you throwing away your toothbrush in the next 2-3 days to avoid reinfecting yourself.  Avoid sharing drinks, kissing, oral secretions with other individuals as you can spread this.  I also sent Mucinex DM in for viral upper respiratory infection.  You can check on your COVID, flu, RSV results in the next 24 hours.  Continue with salt water rinses, cough drops.  Tylenol , Motrin  intermittently before hours for body aches, fever greater than 100.4 is Fahrenheit.  Make sure you drink plenty of water as this is most important.  If you do not eat that is okay.  Return to Emergency Department if you experience shortness of breath, inability to swallow water, worsening symptoms

## 2024-10-19 NOTE — ED Triage Notes (Signed)
 Patient c/o Body aches Chills Coughing/sneezing Sore throat  Since Tuesday Unsure of sick contacts

## 2024-10-19 NOTE — ED Provider Notes (Signed)
 " [place] EMERGENCY DEPARTMENT AT [facility] Provider Note   CSN: [id number] Arrival date & time: 10/19/24  1414     Patient presents with: No chief complaint on file.   Kristine Franco is a 24 y.o. female with past medical history of cannabis use, anxiety presents emergency department for evaluation of bodyaches, chills, productive cough, congestion, sore throat for past 2 days.  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  Has tried cold and flu medicine at home with some relief.  Denies chest pain, shortness of breath.   HPI     Prior to Admission medications  Medication Sig Start Date End Date Taking? Authorizing Provider  amoxicillin  (AMOXIL ) 500 MG capsule Take 1 capsule (500 mg total) by mouth 2 (two) times daily for 10 days. 10/19/24 10/29/24 Yes Minnie Tinnie BRAVO, PA  dextromethorphan-guaiFENesin (MUCINEX DM) 30-600 MG 12hr tablet Take 1 tablet by mouth 2 (two) times daily for 5 days. 10/19/24 10/24/24 Yes Minnie Tinnie BRAVO, PA  Etonogestrel  (NEXPLANON  [place]) Inject 1 Application into the skin once.    [provider]  methocarbamol  (ROBAXIN ) 500 MG tablet Take 2 tablets (1,000 mg total) by mouth every 8 (eight) hours as needed for muscle spasms. 07/22/23   Geiple, Joshua, PA-C    Allergies: Patient has no known allergies.    Review of Systems  Constitutional:  Positive for chills.  HENT:  Positive for congestion.   Respiratory:  Positive for cough.     Updated Vital Signs BP (!) 143/80 (BP Location: Left Arm)   Pulse 79   Temp 99.2 F (37.3 C) (Oral)   Resp 18   Ht 5' 2.5 (1.588 m)   Wt 61.2 kg   SpO2 97%   BMI 24.30 kg/m   Physical Exam Vitals and nursing note reviewed.  Constitutional:      General: She is not in acute distress.    Appearance: Normal appearance. She is not ill-appearing.  HENT:     Head: Normocephalic and atraumatic.     Right Ear: Tympanic membrane, ear canal and external ear normal.     Left  Ear: Tympanic membrane, ear canal and external ear normal.     Mouth/Throat:     Lips: Pink. No lesions.     Mouth: Mucous membranes are moist.     Pharynx: Uvula midline. Posterior oropharyngeal erythema present. No oropharyngeal exudate or uvula swelling.     Tonsils: No tonsillar exudate or tonsillar abscesses. 2+ on the right. 2+ on the left.     Comments: Uvula midline. No abscess, fluctuance noted in oral muscoa Eyes:     General: No scleral icterus.       Right eye: No discharge.        Left eye: No discharge.     Conjunctiva/sclera: Conjunctivae normal.  Cardiovascular:     Rate and Rhythm: Normal rate.     Pulses: Normal pulses.  Pulmonary:     Effort: Pulmonary effort is normal. No respiratory distress.     Breath sounds: Normal breath sounds. No stridor. No wheezing or rhonchi.  Chest:     Chest wall: No tenderness.  Abdominal:     General: There is no distension.     Palpations: Abdomen is soft. There is no mass.     Tenderness: There is no abdominal tenderness. There is no guarding.  Musculoskeletal:     Cervical back: Normal range of motion and neck supple. No rigidity or  tenderness.  Lymphadenopathy:     Cervical: No cervical adenopathy.  Skin:    General: Skin is warm.     Capillary Refill: Capillary refill takes less than 2 seconds.     Coloration: Skin is not jaundiced or pale.  Neurological:     Mental Status: She is alert and oriented to person, place, and time. Mental status is at baseline.     (all labs ordered are listed, but only abnormal results are displayed) Labs Reviewed  GROUP A STREP BY PCR - Abnormal; Notable for the following components:      Result Value   Group A Strep by PCR DETECTED (*)    All other components within normal limits  RESP PANEL BY RT-PCR (RSV, FLU A&B, COVID)  RVPGX2 - Abnormal; Notable for the following components:   Influenza A by PCR POSITIVE (*)    All other components within normal limits     EKG: None  Radiology: No results found.   Medications Ordered in the ED - No data to display                                  Medical Decision Making Risk OTC drugs. Prescription drug management.   Patient presents to the ED for concern of cough, congestion, sore throat, this involves an extensive number of treatment options, and is a complaint that carries with it a high risk of complications and morbidity.  The differential diagnosis includes COVID, flu, RSV, pharyngitis, strep, tonsillar abscess, retropharyngeal abscess, Ludwick's   Co morbidities that complicate the patient evaluation  None   Additional history obtained:  Additional history obtained from Nursing   External records from outside source obtained and reviewed including triage note   Lab Tests:  I Ordered, and personally interpreted labs.  The pertinent results include:   Flu positive Strep positive    Medicines ordered and prescription drug management:  I ordered prescription medication including Mucinex DM, amoxicillin  for cough, congestion, strep I have reviewed the patients home medicines and have made adjustments as needed    Problem List / ED Course:  Influenza Vital signs hemodynamic stable no fever no tachycardia Lungs are CTAB.  Low suspicion for pneumonia. Provide Mucinex prescription Discussed symptomatic treatment at home  Strep pharyngitis Able to swallow without difficulty.  No signs of tonsillar abscess No submandibular swelling or tenderness.  Low suspicion for retropharyngeal abscess No crackles or shortness of breath.  Making oxygen saturation for supplementation Has been drinking water appropriately at home Amoxicillin  prescription.  Discussed antibiotic stewardship Discussed changing toothbrush in the next 2 or 3 days to avoid reinfection.  Discussed avoiding shaving of oral secretions as she may spread to other individuals   Reevaluation:  After the  interventions noted above, I reevaluated the patient and found that they have :stayed the same    Dispostion:  After consideration of the diagnostic results and the patients response to treatment, I feel that the patent would benefit from outpatient management symptomatic treatment  Discussed ED workup, disposition, return to ED precautions with patient who expresses understanding agrees with plan.  All questions answered to their satisfaction.  They are agreeable to plan.  Discharge instructions provided on paperwork  Final diagnoses:  Viral URI with cough  Strep pharyngitis    ED Discharge Orders          Ordered    amoxicillin  (AMOXIL ) 500 MG capsule  2 times daily        10/19/24 1700    dextromethorphan-guaiFENesin (MUCINEX DM) 30-600 MG 12hr tablet  2 times daily        10/19/24 1700             Minnie Tinnie BRAVO, [place] 10/20/24 0000  "
